# Patient Record
Sex: Female | Born: 1955 | Race: White | Hispanic: No | Marital: Married | Smoking: Never smoker
Health system: Southern US, Community
[De-identification: ages and names within clinical notes are randomized; demographics above are authoritative.]

## PROBLEM LIST (undated history)

## (undated) DIAGNOSIS — E7800 Pure hypercholesterolemia, unspecified: Secondary | ICD-10-CM

## (undated) DIAGNOSIS — K219 Gastro-esophageal reflux disease without esophagitis: Secondary | ICD-10-CM

## (undated) DIAGNOSIS — C541 Malignant neoplasm of endometrium: Secondary | ICD-10-CM

## (undated) DIAGNOSIS — M1990 Unspecified osteoarthritis, unspecified site: Secondary | ICD-10-CM

## (undated) DIAGNOSIS — J45909 Unspecified asthma, uncomplicated: Secondary | ICD-10-CM

## (undated) DIAGNOSIS — T7840XA Allergy, unspecified, initial encounter: Secondary | ICD-10-CM

## (undated) HISTORY — DX: Malignant neoplasm of endometrium: C54.1

## (undated) HISTORY — DX: Allergy, unspecified, initial encounter: T78.40XA

## (undated) HISTORY — PX: ABDOMINAL HYSTERECTOMY: SHX81

## (undated) HISTORY — DX: Gastro-esophageal reflux disease without esophagitis: K21.9

## (undated) HISTORY — DX: Unspecified asthma, uncomplicated: J45.909

---

## 1999-12-07 ENCOUNTER — Encounter: Payer: Self-pay | Admitting: Obstetrics and Gynecology

## 1999-12-07 ENCOUNTER — Encounter: Admission: RE | Admit: 1999-12-07 | Discharge: 1999-12-07 | Payer: Self-pay | Admitting: Obstetrics and Gynecology

## 2002-10-23 ENCOUNTER — Encounter: Payer: Self-pay | Admitting: Obstetrics and Gynecology

## 2002-10-23 ENCOUNTER — Encounter: Admission: RE | Admit: 2002-10-23 | Discharge: 2002-10-23 | Payer: Self-pay | Admitting: Obstetrics and Gynecology

## 2002-11-30 ENCOUNTER — Encounter: Admission: RE | Admit: 2002-11-30 | Discharge: 2002-11-30 | Payer: Self-pay | Admitting: Obstetrics and Gynecology

## 2002-11-30 ENCOUNTER — Encounter: Payer: Self-pay | Admitting: Obstetrics and Gynecology

## 2005-04-15 ENCOUNTER — Ambulatory Visit: Payer: Self-pay | Admitting: Internal Medicine

## 2005-05-14 ENCOUNTER — Ambulatory Visit: Payer: Self-pay | Admitting: Internal Medicine

## 2005-07-01 ENCOUNTER — Ambulatory Visit: Payer: Self-pay | Admitting: Internal Medicine

## 2005-07-02 ENCOUNTER — Ambulatory Visit: Payer: Self-pay | Admitting: Internal Medicine

## 2005-07-02 ENCOUNTER — Ambulatory Visit (HOSPITAL_COMMUNITY): Admission: RE | Admit: 2005-07-02 | Discharge: 2005-07-02 | Payer: Self-pay | Admitting: Internal Medicine

## 2005-07-23 ENCOUNTER — Ambulatory Visit: Payer: Self-pay | Admitting: Internal Medicine

## 2007-10-24 DIAGNOSIS — J309 Allergic rhinitis, unspecified: Secondary | ICD-10-CM | POA: Insufficient documentation

## 2007-10-24 DIAGNOSIS — J209 Acute bronchitis, unspecified: Secondary | ICD-10-CM | POA: Insufficient documentation

## 2007-10-25 ENCOUNTER — Ambulatory Visit: Payer: Self-pay | Admitting: Internal Medicine

## 2007-10-27 ENCOUNTER — Telehealth (INDEPENDENT_AMBULATORY_CARE_PROVIDER_SITE_OTHER): Payer: Self-pay | Admitting: *Deleted

## 2008-02-20 LAB — HM PAP SMEAR: HM Pap smear: ABNORMAL

## 2010-06-02 NOTE — Assessment & Plan Note (Signed)
Summary: FOLLOW UP/ MBW   Visit Type:  Follow-up PCP:  [facility]  Chief Complaint:  follow up visit .  History of Present Illness: Current Problems:  ALLERGIC RHINITIS (ICD-477.9) ASTHMATIC BRONCHITIS, ACUTE (ICD-52.52)  55 year old woman returning for follow-up of allergic rhinitis and asthmatic bronchitis.  She says she is neither worse nor better.  Phlegm in her throat in the mornings.  Feels full, under the xiphoid, without reflux.  Does not see benefit from Xopenex rescue inhaler.  Had normal pulmonary function tests 2007.  Normal CT scan, spiral, March of 2007.       Current Allergies (reviewed today): ! SULFA ! SEPTRA ! BIAXIN ! * ADVAIR ! ADVIL ! * ASPRIN  Past Medical History:    Reviewed history and no changes required:       ALLERGIC RHINITIS (ICD-477.9)       ASTHMATIC BRONCHITIS, ACUTE (ICD-466.0)            Review of Systems      See HPI   Vital Signs:  Patient Profile:   55 Years Old Female Weight:      196.25 pounds O2 Sat:      98 % O2 treatment:    Room Air Pulse rate:   96 / minute BP sitting:   150 / 84  (right arm) Cuff size:   regular  Vitals Entered By: Reynaldo Minium CMA (October 25, 2007 10:44 AM)             Comments Medications reviewed with patient Reynaldo Minium CMA  October 25, 2007 10:44 AM      Physical Exam  GENERAL:  A/Ox3; pleasant & cooperative.NAD HEENT:  [place]/AT, EOM-wnl, PERRLA, EACs-clear, TMs-wnl, NOSE-clear, THROAT-clear & wnl. NECK:  Supple w/ fair ROM; no JVD; normal carotid impulses w/o bruits; no thyromegaly or nodules palpated; no lymphadenopathy. CHEST: Clear to P&A HEART:  RRR, no m/r/g  heard ABDOMEN:  Soft & nt; nml bowel sounds; no organomegaly or masses detected. EXT: Warm bilat,  no calf pain, edema, clubbing, pulses intact Skin: no rash/lesion       Impression & Recommendations:  Problem # 1:  ALLERGIC RHINITIS (ICD-477.9) Assessment: Unchanged we discussed treatment options, and suggested,  Claritin.  Problem # 2:  ASTHMATIC BRONCHITIS, ACUTE (ICD-466.0) Assessment: Unchanged I discussed reflux precautions, and uses an over-the-counter acid blocker.  This may reduce the sensation of phlegm.  She feels in her throat in the mornings and may reduce the substernal pressure discomfort. Her updated medication list for this problem includes:    Xopenex Hfa 45 Mcg/act Aero (Levalbuterol tartrate) .Marland Kitchen... 2 puffs four times a day as needed   Medications Added to Medication List This Visit: 1)  Omega-3 Fish Oil 1000 Mg Caps (Omega-3 fatty acids) .... Take 1 by mouth once daily 2)  Biotene Dry Mouth Gel (Dentifrices) .... Once daily 3)  Xopenex Hfa 45 Mcg/act Aero (Levalbuterol tartrate) .... 2 puffs four times a day as needed   Patient Instructions: 1)  Please schedule a follow-up appointment in 1 year. 2)  Xopenex refilled 3)  Claritin is ok as needed 4)  Try taking an acid inhibitor- Prilosec or Pepcid (otc) before breakfast every day for a month to see if it helps the chest discomfot.   Prescriptions: XOPENEX HFA 45 MCG/ACT  AERO (LEVALBUTEROL TARTRATE) 2 puffs four times a day as needed  #1 x prn   Entered and Authorized by:   Waymon Budge MD   Signed by:  Waymon Budge MD on 10/25/2007   Method used:   Print then Give to Patient   RxID:   [id number]  ]  Appended Document: FOLLOW UP/ MBW-update family hx        Current Allergies: ! SULFA ! SEPTRA ! BIAXIN ! * ADVAIR ! ADVIL ! * ASPRIN   Family History:    Mother- living age 56; pt of Muskan Bolla's; asthma    Father- deceased age 71; heart disease    Sister- living age 58    Brother- living age 57            Heart Disease-grandfather, uncle     Cancer-aunt  Social History:    Never Smoked    Married with 2 children         ]

## 2010-06-02 NOTE — Progress Notes (Signed)
Summary: NEED PRESCRIPT  Phone Note Call from Patient   Caller: Patient Call For: YOUNG Summary of Call: WANT TO SPEAK TO NURSE ABOUT GETTING PRESCRIPT FOR PROTONIX [facility]  Follow-up for Phone Call        PT WANTS TO KNOW IF SHE CAN HAVE RX FOR PROTONIX-LAST OV CY SUGGESTED [facility] BUT PT'S  INS WILL PAY FOR PROTONIX-IF OK SEND RX TO [facility] Follow-up by: Philipp Deputy CMA,  October 27, 2007 2:26 PM  Additional Follow-up for Phone Call Additional follow up Details #1::        Have sent Rx Protonix to her drug store. Please let her know. Additional Follow-up by: Waymon Budge MD,  October 30, 2007 11:20 AM    Additional Follow-up for Phone Call Additional follow up Details #2::    PT AWARE  Philipp Deputy [facility]  October 30, 2007 11:58 AM   New/Updated Medications: PROTONIX 40 MG  TBEC (PANTOPRAZOLE SODIUM) 1 daily before meal   Prescriptions: PROTONIX 40 MG  TBEC (PANTOPRAZOLE SODIUM) 1 daily before meal  #30 x prn   Entered and Authorized by:   Waymon Budge MD   Signed by:   Waymon Budge MD on 10/30/2007   Method used:   [facility]ron[facility] to ...       [facility] [address] [phone]*       [address]       [place], [place]  [address]       Ph: [phone] or [phone]       Fax: [phone]   RxID:   [id number]

## 2010-11-05 ENCOUNTER — Encounter: Payer: Self-pay | Admitting: Family Medicine

## 2010-11-05 DIAGNOSIS — K219 Gastro-esophageal reflux disease without esophagitis: Secondary | ICD-10-CM | POA: Insufficient documentation

## 2010-11-05 DIAGNOSIS — C541 Malignant neoplasm of endometrium: Secondary | ICD-10-CM | POA: Insufficient documentation

## 2013-12-03 ENCOUNTER — Other Ambulatory Visit (HOSPITAL_COMMUNITY)
Admission: RE | Admit: 2013-12-03 | Discharge: 2013-12-03 | Disposition: A | Discharge status: Home / Self Care | Payer: BC Managed Care – PPO | Source: Ambulatory Visit | Attending: Family Medicine | Admitting: Family Medicine

## 2013-12-03 DIAGNOSIS — Z01419 Encounter for gynecological examination (general) (routine) without abnormal findings: Secondary | ICD-10-CM | POA: Insufficient documentation

## 2015-01-03 ENCOUNTER — Emergency Department (HOSPITAL_COMMUNITY)
Admission: EM | Admit: 2015-01-03 | Discharge: 2015-01-03 | Disposition: A | Discharge status: Home / Self Care | Payer: BC Managed Care – PPO | Attending: Emergency Medicine | Admitting: Emergency Medicine

## 2015-01-03 ENCOUNTER — Emergency Department (HOSPITAL_COMMUNITY): Payer: BC Managed Care – PPO

## 2015-01-03 ENCOUNTER — Encounter (HOSPITAL_COMMUNITY): Payer: Self-pay | Admitting: Emergency Medicine

## 2015-01-03 DIAGNOSIS — S199XXA Unspecified injury of neck, initial encounter: Secondary | ICD-10-CM | POA: Diagnosis present

## 2015-01-03 DIAGNOSIS — S1093XA Contusion of unspecified part of neck, initial encounter: Secondary | ICD-10-CM | POA: Insufficient documentation

## 2015-01-03 DIAGNOSIS — S1091XA Abrasion of unspecified part of neck, initial encounter: Secondary | ICD-10-CM | POA: Diagnosis not present

## 2015-01-03 DIAGNOSIS — Y9389 Activity, other specified: Secondary | ICD-10-CM | POA: Insufficient documentation

## 2015-01-03 DIAGNOSIS — Y92410 Unspecified street and highway as the place of occurrence of the external cause: Secondary | ICD-10-CM | POA: Insufficient documentation

## 2015-01-03 DIAGNOSIS — Z79899 Other long term (current) drug therapy: Secondary | ICD-10-CM | POA: Insufficient documentation

## 2015-01-03 DIAGNOSIS — Z8542 Personal history of malignant neoplasm of other parts of uterus: Secondary | ICD-10-CM | POA: Insufficient documentation

## 2015-01-03 DIAGNOSIS — Z8719 Personal history of other diseases of the digestive system: Secondary | ICD-10-CM | POA: Insufficient documentation

## 2015-01-03 DIAGNOSIS — Z8639 Personal history of other endocrine, nutritional and metabolic disease: Secondary | ICD-10-CM | POA: Diagnosis not present

## 2015-01-03 DIAGNOSIS — Y998 Other external cause status: Secondary | ICD-10-CM | POA: Insufficient documentation

## 2015-01-03 DIAGNOSIS — J45909 Unspecified asthma, uncomplicated: Secondary | ICD-10-CM | POA: Insufficient documentation

## 2015-01-03 DIAGNOSIS — S50812A Abrasion of left forearm, initial encounter: Secondary | ICD-10-CM | POA: Diagnosis not present

## 2015-01-03 DIAGNOSIS — S8012XA Contusion of left lower leg, initial encounter: Secondary | ICD-10-CM | POA: Insufficient documentation

## 2015-01-03 HISTORY — DX: Pure hypercholesterolemia, unspecified: E78.00

## 2015-01-03 LAB — I-STAT CHEM 8, ED
BUN: 17 mg/dL (ref 6–20)
Calcium, Ion: 1.11 mmol/L — ABNORMAL LOW (ref 1.13–1.30)
Chloride: 103 mmol/L (ref 101–111)
Creatinine, Ser: 0.7 mg/dL (ref 0.44–1.00)
Glucose, Bld: 108 mg/dL — ABNORMAL HIGH (ref 65–99)
HCT: 43 % (ref 36.0–46.0)
Hemoglobin: 14.6 g/dL (ref 12.0–15.0)
Potassium: 4.3 mmol/L (ref 3.5–5.1)
Sodium: 135 mmol/L (ref 135–145)
TCO2: 23 mmol/L (ref 0–100)

## 2015-01-03 MED ORDER — ACETAMINOPHEN 500 MG PO TABS
1000.0000 mg | ORAL_TABLET | Freq: Once | ORAL | Status: AC
  Administered 2015-01-03: 1000 mg via ORAL
  Filled 2015-01-03: qty 2

## 2015-01-03 MED ORDER — OXYCODONE-ACETAMINOPHEN 5-325 MG PO TABS: 1.0000 | ORAL_TABLET | ORAL | Status: DC | PRN
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
## 2015-01-03 NOTE — Discharge Instructions (Signed)
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
## 2015-01-07 MED ORDER — IOHEXOL 350 MG/ML SOLN
80.0000 mL | Freq: Once | INTRAVENOUS | Status: AC | PRN
  Administered 2015-01-07: 80 mL via INTRAVENOUS

## 2015-03-14 ENCOUNTER — Other Ambulatory Visit: Payer: Self-pay | Admitting: Family Medicine

## 2015-03-14 ENCOUNTER — Ambulatory Visit
Admission: RE | Admit: 2015-03-14 | Discharge: 2015-03-14 | Disposition: A | Discharge status: Home / Self Care | Payer: BC Managed Care – PPO | Source: Ambulatory Visit | Attending: Family Medicine | Admitting: Family Medicine

## 2015-03-14 DIAGNOSIS — S8012XA Contusion of left lower leg, initial encounter: Secondary | ICD-10-CM
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
## 2020-08-04 DIAGNOSIS — Z1231 Encounter for screening mammogram for malignant neoplasm of breast: Secondary | ICD-10-CM | POA: Diagnosis not present

## 2020-08-14 DIAGNOSIS — R928 Other abnormal and inconclusive findings on diagnostic imaging of breast: Secondary | ICD-10-CM | POA: Diagnosis not present

## 2020-08-28 DIAGNOSIS — D0511 Intraductal carcinoma in situ of right breast: Secondary | ICD-10-CM | POA: Diagnosis not present

## 2020-08-28 DIAGNOSIS — R928 Other abnormal and inconclusive findings on diagnostic imaging of breast: Secondary | ICD-10-CM | POA: Diagnosis not present

## 2020-08-31 DIAGNOSIS — C50919 Malignant neoplasm of unspecified site of unspecified female breast: Secondary | ICD-10-CM

## 2020-08-31 HISTORY — DX: Malignant neoplasm of unspecified site of unspecified female breast: C50.919

## 2020-09-01 ENCOUNTER — Telehealth: Payer: Self-pay | Admitting: Hematology and Oncology

## 2020-09-01 NOTE — Telephone Encounter (Signed)
Spoke to patient to confirm morning [facility] appointment for 5/11, emailed packet

## 2020-09-02 ENCOUNTER — Encounter: Payer: Self-pay | Admitting: Family Medicine

## 2020-09-05 ENCOUNTER — Encounter: Payer: Self-pay | Admitting: *Deleted

## 2020-09-05 DIAGNOSIS — D0511 Intraductal carcinoma in situ of right breast: Secondary | ICD-10-CM | POA: Insufficient documentation

## 2020-09-09 ENCOUNTER — Encounter: Payer: Self-pay | Admitting: *Deleted

## 2020-09-09 NOTE — Progress Notes (Signed)
[place] NOTE  Patient Care Team: Susy Frizzle, MD as PCP - General (Family Medicine) Mauro Kaufmann, RN as Oncology Nurse Navigator Rockwell Germany, RN as Oncology Nurse Navigator Rolm Bookbinder, MD as Consulting Physician (General Surgery) Nicholas Lose, MD as Consulting Physician (Hematology and Oncology) Gery Pray, MD as Consulting Physician (Radiation Oncology)  CHIEF COMPLAINTS/PURPOSE OF CONSULTATION:  Newly diagnosed DCIS of the right breast  HISTORY OF PRESENTING ILLNESS:  Monica Perry 65 y.o. female is here because of recent diagnosis of right breast DCIS. Screening mammogram on 08/04/20 showed a 1.6cm group of calcifications at the 9 o'clock position in the right breast. Diagnostic mammogram and [place] on 08/14/20 showed the 1.6cm group of calcifications. She presents to the clinic today for initial evaluation and discussion of treatment options.   I reviewed her records extensively and collaborated the history with the patient.  SUMMARY OF ONCOLOGIC HISTORY: 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%     MEDICAL HISTORY:  Past Medical History:  Diagnosis Date  . Allergy   . Asthmatic bronchitis   . Elevated cholesterol   . Endometrial carcinoma ([place])   . GERD (gastroesophageal reflux disease)     SURGICAL HISTORY: Past Surgical History:  Procedure Laterality Date  . ABDOMINAL HYSTERECTOMY    . CESAREAN SECTION      SOCIAL HISTORY: Social History   Socioeconomic History  . Marital status: Married    Spouse name: Not on file  . Number of children: Not on file  . Years of education: Not on file  . Highest education level: Not on file  Occupational History  . Not on file  Tobacco Use  . Smoking status: Never Smoker   . Smokeless tobacco: Not on file  Substance and Sexual Activity  . Alcohol use: No  . Drug use: No  . Sexual activity: Not on file  Other Topics Concern  . Not on file  Social History Narrative  . Not on file   Social Determinants of Health   Financial Resource Strain: Not on file  Food Insecurity: Not on file  Transportation Needs: Not on file  Physical Activity: Not on file  Stress: Not on file  Social Connections: Not on file  Intimate Partner Violence: Not on file    FAMILY HISTORY: No family history on file.  ALLERGIES:  is allergic to clarithromycin, fluticasone-salmeterol, ibuprofen, red dye, septra [sulfamethoxazole-trimethoprim], and sulfonamide derivatives.  MEDICATIONS:  Current Outpatient Medications  Medication Sig Dispense Refill  . Biotin 5 MG CAPS Take by mouth.    . Calcium Carb-Cholecalciferol (CALCIUM 1000 + D PO) Take 1 tablet by mouth daily.    . calcium carbonate (TUMS - DOSED IN MG ELEMENTAL CALCIUM) 500 MG chewable tablet Chew 1 tablet by mouth daily.    . fish oil-omega-3 fatty acids 1000 MG capsule Take 2 g by mouth daily.    . Multiple Vitamins-Minerals (MULTIVITAMIN WITH MINERALS) tablet Take 1 tablet by mouth daily.    . pravastatin (PRAVACHOL) 20 MG tablet Take 1 tablet by mouth daily.     No current facility-administered medications for this visit.    REVIEW OF SYSTEMS:     All other systems were reviewed with the patient and are negative.  PHYSICAL EXAMINATION: ECOG PERFORMANCE STATUS: 1 - Symptomatic  but completely ambulatory  Vitals:   09/10/20 0849  BP: (!) 169/90  Pulse: 79  Resp: 16  Temp: (!) 97.2 F (36.2 C)  SpO2: 97%   Filed Weights   09/10/20 0849  Weight: 187 lb 11.2 oz (85.1 kg)      LABORATORY DATA:  I have reviewed the data as listed Lab Results  Component Value Date   WBC 7.2 09/10/2020   HGB 14.3 09/10/2020   HCT 42.8 09/10/2020   MCV 88.2 09/10/2020   PLT 260 09/10/2020   Lab Results  Component  Value Date   NA 142 09/10/2020   K 3.9 09/10/2020   CL 105 09/10/2020   CO2 26 09/10/2020    RADIOGRAPHIC STUDIES: I have personally reviewed the radiological reports and agreed with the findings in the report.  ASSESSMENT AND PLAN:  Ductal carcinoma in situ (DCIS) of right breast 09/05/2020:Screening detected left breast calcifications 0.4 cm span, stereotactic biopsy revealed high-grade DCIS with calcifications and necrosis ER 80%, PR 5%  Pathology review: I discussed with the patient the difference between DCIS and invasive breast cancer. It is considered a precancerous lesion. DCIS is classified as a 0. It is generally detected through mammograms as calcifications. We discussed the significance of grades and its impact on prognosis. We also discussed the importance of ER and PR receptors and their implications to adjuvant treatment options. Prognosis of DCIS dependence on grade, comedo necrosis. It is anticipated that if not treated, 20-30% of DCIS can develop into invasive breast cancer.  Recommendation: 1. Breast conserving surgery 2. Followed by adjuvant radiation therapy 3. Followed by antiestrogen therapy with tamoxifen 5 years  Tamoxifen counseling: We discussed the risks and benefits of tamoxifen. These include but not limited to insomnia, hot flashes, mood changes, vaginal dryness, and weight gain. Although rare, serious side effects including endometrial cancer, risk of blood clots were also discussed. We strongly believe that the benefits far outweigh the risks. Patient understands these risks and consented to starting treatment. Planned treatment duration is 5 years.  Return to clinic after surgery to discuss the final pathology report and come up with an adjuvant treatment plan.     All questions were answered. The patient knows to call the clinic with any problems, questions or concerns.   Rulon Eisenmenger, MD, MPH 09/10/2020    I, Molly Dorshimer, am acting as scribe  for Nicholas Lose, MD.  I have reviewed the above documentation for accuracy and completeness, and I agree with the above.

## 2020-09-10 ENCOUNTER — Inpatient Hospital Stay: Payer: Medicare PPO | Attending: Hematology and Oncology | Admitting: Hematology and Oncology

## 2020-09-10 ENCOUNTER — Ambulatory Visit: Payer: Self-pay | Admitting: Physical Therapy

## 2020-09-10 ENCOUNTER — Encounter: Payer: Self-pay | Admitting: *Deleted

## 2020-09-10 ENCOUNTER — Ambulatory Visit
Admission: RE | Admit: 2020-09-10 | Discharge: 2020-09-10 | Disposition: A | Discharge status: Home / Self Care | Payer: Medicare PPO | Source: Ambulatory Visit | Attending: Radiation Oncology | Admitting: Radiation Oncology

## 2020-09-10 ENCOUNTER — Encounter: Payer: Self-pay | Admitting: Licensed Clinical Social Worker

## 2020-09-10 ENCOUNTER — Ambulatory Visit: Payer: Medicare PPO | Admitting: Genetic Counselor

## 2020-09-10 ENCOUNTER — Other Ambulatory Visit: Payer: Self-pay | Admitting: General Surgery

## 2020-09-10 ENCOUNTER — Other Ambulatory Visit: Payer: Self-pay

## 2020-09-10 ENCOUNTER — Inpatient Hospital Stay: Payer: Medicare PPO

## 2020-09-10 ENCOUNTER — Encounter: Payer: Self-pay | Admitting: Genetic Counselor

## 2020-09-10 DIAGNOSIS — Z8542 Personal history of malignant neoplasm of other parts of uterus: Secondary | ICD-10-CM | POA: Diagnosis not present

## 2020-09-10 DIAGNOSIS — D0511 Intraductal carcinoma in situ of right breast: Secondary | ICD-10-CM

## 2020-09-10 DIAGNOSIS — Z8041 Family history of malignant neoplasm of ovary: Secondary | ICD-10-CM

## 2020-09-10 DIAGNOSIS — C541 Malignant neoplasm of endometrium: Secondary | ICD-10-CM

## 2020-09-10 DIAGNOSIS — Z170 Estrogen receptor positive status [ER+]: Secondary | ICD-10-CM | POA: Diagnosis not present

## 2020-09-10 DIAGNOSIS — Z79899 Other long term (current) drug therapy: Secondary | ICD-10-CM | POA: Insufficient documentation

## 2020-09-10 DIAGNOSIS — K219 Gastro-esophageal reflux disease without esophagitis: Secondary | ICD-10-CM | POA: Diagnosis not present

## 2020-09-10 DIAGNOSIS — Z803 Family history of malignant neoplasm of breast: Secondary | ICD-10-CM | POA: Diagnosis not present

## 2020-09-10 DIAGNOSIS — E7800 Pure hypercholesterolemia, unspecified: Secondary | ICD-10-CM | POA: Diagnosis not present

## 2020-09-10 DIAGNOSIS — Z808 Family history of malignant neoplasm of other organs or systems: Secondary | ICD-10-CM

## 2020-09-10 HISTORY — DX: Personal history of malignant neoplasm of other parts of uterus: Z85.42

## 2020-09-10 HISTORY — DX: Family history of malignant neoplasm of other organs or systems: Z80.8

## 2020-09-10 HISTORY — DX: Family history of malignant neoplasm of breast: Z80.3

## 2020-09-10 HISTORY — DX: Family history of malignant neoplasm of ovary: Z80.41

## 2020-09-10 LAB — CBC WITH DIFFERENTIAL (CANCER CENTER ONLY)
Abs Immature Granulocytes: 0.04 10*3/uL (ref 0.00–0.07)
Basophils Absolute: 0.1 10*3/uL (ref 0.0–0.1)
Basophils Relative: 1 %
Eosinophils Absolute: 0.1 10*3/uL (ref 0.0–0.5)
Eosinophils Relative: 1 %
HCT: 42.8 % (ref 36.0–46.0)
Hemoglobin: 14.3 g/dL (ref 12.0–15.0)
Immature Granulocytes: 1 %
Lymphocytes Relative: 15 %
Lymphs Abs: 1.1 10*3/uL (ref 0.7–4.0)
MCH: 29.5 pg (ref 26.0–34.0)
MCHC: 33.4 g/dL (ref 30.0–36.0)
MCV: 88.2 fL (ref 80.0–100.0)
Monocytes Absolute: 0.5 10*3/uL (ref 0.1–1.0)
Monocytes Relative: 7 %
Neutro Abs: 5.5 10*3/uL (ref 1.7–7.7)
Neutrophils Relative %: 75 %
Platelet Count: 260 10*3/uL (ref 150–400)
RBC: 4.85 MIL/uL (ref 3.87–5.11)
RDW: 13 % (ref 11.5–15.5)
WBC Count: 7.2 10*3/uL (ref 4.0–10.5)
nRBC: 0 % (ref 0.0–0.2)

## 2020-09-10 LAB — CMP (CANCER CENTER ONLY)
ALT: 16 U/L (ref 0–44)
AST: 19 U/L (ref 15–41)
Albumin: 4.1 g/dL (ref 3.5–5.0)
Alkaline Phosphatase: 79 U/L (ref 38–126)
Anion gap: 11 (ref 5–15)
BUN: 12 mg/dL (ref 8–23)
CO2: 26 mmol/L (ref 22–32)
Calcium: 10.4 mg/dL — ABNORMAL HIGH (ref 8.9–10.3)
Chloride: 105 mmol/L (ref 98–111)
Creatinine: 0.73 mg/dL (ref 0.44–1.00)
GFR, Estimated: >60 mL/min (ref >60)
Glucose, Bld: 102 mg/dL — ABNORMAL HIGH (ref 70–99)
Potassium: 3.9 mmol/L (ref 3.5–5.1)
Sodium: 142 mmol/L (ref 135–145)
Total Bilirubin: 0.6 mg/dL (ref 0.3–1.2)
Total Protein: 7.2 g/dL (ref 6.5–8.1)

## 2020-09-10 LAB — GENETIC SCREENING ORDER

## 2020-09-10 NOTE — Progress Notes (Signed)
Radiation Oncology         [phone] ________________________________  Multidisciplinary Breast Oncology Clinic [facility]) Initial Outpatient Consultation  Name: Monica Perry MRN: [id number]  Date: 09/10/2020  DOB: [birth year]  [id number], Cammie Mcgee, MD  Rolm Bookbinder, MD   REFERRING PHYSICIAN: Rolm Bookbinder, MD  DIAGNOSIS: The encounter diagnosis was Endometrial carcinoma [facility]).  Stage 0 Right Breast UOQ, DCIS, ER + / PR + /, Grade 3      ICD-10-CM   1. Endometrial carcinoma (HCC)  C54.1     HISTORY OF PRESENT ILLNESS::Cire Jerilynn Mages Watters is a 65 y.o. female who is presenting to the office today for evaluation of her newly diagnosed breast cancer. She is accompanied by her identical twin sister. (Her daughters are also identical twins.) She is doing well overall.  She notes a maternal aunt with ovarian cancer, which qualifies her for genetic testing.   She had routine screening mammography on 08/04/20 showing a possible abnormality in the right breast. She underwent bilateral diagnostic mammography with tomography at [facility] on 08/14/20 showing: 1.6cm group of linear branching calcification at 9 o'clock.  Biopsy on 08/28/20 showed: DCIS, high grade. Prognostic indicators significant for: estrogen receptor, 90% positive and progesterone receptor, 35% positive , both with moderate/ strong staining intensity.   Menarche: 65 years old Age at first live birth: 65 years old GP: 2 LMP: 2009 Contraceptive: Yes, for the past 3 years HRT: No   The patient was referred today for presentation in the multidisciplinary conference.  Radiology studies and pathology slides were presented there for review and discussion of treatment options.  A consensus was discussed regarding potential next steps.  PREVIOUS RADIATION THERAPY: No  PAST MEDICAL HISTORY:  Past Medical History:  Diagnosis Date  . Allergy   . Asthmatic bronchitis   . Elevated cholesterol   . Endometrial carcinoma ([place])    . Family history of breast cancer 09/10/2020  . Family history of melanoma 09/10/2020  . Family history of ovarian cancer 09/10/2020  . GERD (gastroesophageal reflux disease)   . History of endometrial cancer 09/10/2020    PAST SURGICAL HISTORY: Past Surgical History:  Procedure Laterality Date  . ABDOMINAL HYSTERECTOMY    . CESAREAN SECTION      FAMILY HISTORY:  Family History  Problem Relation Age of Onset  . Lymphoma Brother        dx late 23s  . Melanoma Brother        dx after 76  . Ovarian cancer Maternal Aunt        dx 50s  . Breast cancer Paternal Aunt        dx 6s  . Uterine cancer Maternal Aunt        dx 69s    SOCIAL HISTORY:  Social History   Socioeconomic History  . Marital status: Married    Spouse name: Not on file  . Number of children: Not on file  . Years of education: Not on file  . Highest education level: Not on file  Occupational History  . Not on file  Tobacco Use  . Smoking status: Never Smoker  . Smokeless tobacco: Not on file  Substance and Sexual Activity  . Alcohol use: No  . Drug use: No  . Sexual activity: Not on file  Other Topics Concern  . Not on file  Social History Narrative  . 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  ALLERGIES:  Allergies  Allergen Reactions  . Clarithromycin   . Fluticasone-Salmeterol   . Ibuprofen   . Red Dye   . Septra [Sulfamethoxazole-Trimethoprim]   . Sulfonamide Derivatives     MEDICATIONS:  Current Outpatient Medications  Medication Sig Dispense Refill  . Biotin 5 MG CAPS Take by mouth.    . Calcium Carb-Cholecalciferol (CALCIUM 1000 + D PO) Take 1 tablet by mouth daily.    . calcium carbonate (TUMS - DOSED IN MG  ELEMENTAL CALCIUM) 500 MG chewable tablet Chew 1 tablet by mouth daily.    . fish oil-omega-3 fatty acids 1000 MG capsule Take 2 g by mouth daily.    . Multiple Vitamins-Minerals (MULTIVITAMIN WITH MINERALS) tablet Take 1 tablet by mouth daily.    . pravastatin (PRAVACHOL) 20 MG tablet Take 1 tablet by mouth daily.     No current facility-administered medications for this encounter.    REVIEW OF SYSTEMS: A 10+ POINT REVIEW OF SYSTEMS WAS OBTAINED including neurology, dermatology, psychiatry, cardiac, respiratory, lymph, extremities, GI, GU, musculoskeletal, constitutional, reproductive, HEENT. On the provided form, she reports no current issues. She denies any symptoms today.  No reports of breast pain nipple discharge or bleeding   PHYSICAL EXAM:  Vitals with BMI 09/10/2020  Height 5\' 4"   Weight 187 lbs 11 oz  BMI 93.7  Systolic 169  Diastolic 90  Pulse 79   Lungs are clear to auscultation bilaterally. Heart has regular rate and rhythm. No palpable cervical, supraclavicular, or axillary adenopathy. Abdomen soft, non-tender, normal bowel sounds. Breast: Left breast with no palpable mass, nipple discharge, or bleeding. Right breast with biopsy site in 9 o'clock position, approximately 2 cm from areolar border, some bruising in this area and along nipple/areolar area, palpable induration from biopsy.   KPS = 90  100 - Normal; no complaints; no evidence of disease. 90   - Able to carry on normal activity; minor signs or symptoms of disease. 80   - Normal activity with effort; some signs or symptoms of disease. 15   - Cares for self; unable to carry on normal activity or to do active work. 60   - Requires occasional assistance, but is able to care for most of his personal needs. 50   - Requires considerable assistance and frequent medical care. 23   - Disabled; requires special care and assistance. 44   - Severely disabled; hospital admission is indicated although death not imminent. 45    - Very sick; hospital admission necessary; active supportive treatment necessary. 10   - Moribund; fatal processes progressing rapidly. 0     - Dead  Karnofsky DA, Abelmann [place], Craver LS and Burchenal [facility] [phone]) The use of the nitrogen mustards in the palliative treatment of carcinoma: with particular reference to bronchogenic carcinoma Cancer 1 634-56  LABORATORY DATA:  Lab Results  Component Value Date   WBC 7.2 09/10/2020   HGB 14.3 09/10/2020   HCT 42.8 09/10/2020   MCV 88.2 09/10/2020   PLT 260 09/10/2020   Lab Results  Component Value Date   NA 142 09/10/2020   K 3.9 09/10/2020   CL 105 09/10/2020   CO2 26 09/10/2020   Lab Results  Component Value Date   ALT 16 09/10/2020  AST 19 09/10/2020   ALKPHOS 79 09/10/2020   BILITOT 0.6 09/10/2020    PULMONARY FUNCTION TEST:   Recent Review Flowsheet Data   There is no flowsheet data to display.     RADIOGRAPHY: No results found.    IMPRESSION:  Stage 0 Right Breast UOQ, DCIS, ER + / PR + /, Grade 3   Patient will be a good candidate for breast conservation with radiotherapy to right breast. We discussed the general course of radiation, potential side effects, and toxicities with radiation and the patient is interested in this approach.   PLAN:  1. Genetic Testing (family history of ovarian cancer) 2. Right lumpectomy 3. Adjuvant radiation therapy 4. Anti-estrogen therapy    ------------------------------------------------  Blair Promise, PhD, MD  This document serves as a record of services personally performed by Gery Pray, MD. It was created on his behalf by Roney Mans, a trained medical scribe. The creation of this record is based on the scribe's personal observations and the provider's statements to them. This document has been checked and approved by the attending provider.

## 2020-09-10 NOTE — Progress Notes (Signed)
[place] Work  Initial Assessment   Monica Perry is a 65 y.o. year old female accompanied by patient and twin sister. Clinical Social Work was referred by [facility] for assessment of psychosocial needs.   SDOH (Social Determinants of Health) assessments performed: Yes SDOH Interventions   Flowsheet Row Most Recent Value  SDOH Interventions   Food Insecurity Interventions Intervention Not Indicated  Financial Strain Interventions Intervention Not Indicated  Housing Interventions Intervention Not Indicated  Transportation Interventions Intervention Not Indicated      Distress Screen completed: Yes ONCBCN DISTRESS SCREENING 09/10/2020  Screening Type Initial Screening  Distress experienced in past week (1-10) 7  Family Problem type Children  Emotional problem type Nervousness/Anxiety;Adjusting to illness      Family/Social Information:  . Housing Arrangement: patient lives with husband, Monica Perry. Twin 49yo daughters both live in [place] . Family members/support persons in your life? Family, particularly her husband and sister . Transportation concerns: no  . 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 Income source: Employment and retirement income . Financial concerns: No o Type of concern: None . Food access concerns: no . Religious or spiritual practice: yes . Services Currently in place:  n/a  Coping/ Adjustment to diagnosis: . Patient understands treatment plan and what happens next? yes . Concerns about diagnosis and/or treatment: had concerns before today but is starting to feel better after learning more about specific diagnosis and treatment plan . Patient reported stressors: Children and Adjusting to my illness . Patient enjoys reading and going to the beach, working with kids, time with grandkids . Current coping skills/ strengths: Ability for insight, Capable of independent living, Motivation for treatment/growth, Special hobby/interest and Supportive  family/friends    SUMMARY: Current SDOH Barriers:  . No significant SDOH barriers noted today  Interventions: . Discussed common feeling and emotions when being diagnosed with cancer, and the importance of support during treatment . Informed patient of the support team roles and support services at [facility] . Provided CSW contact information and encouraged patient to call with any questions or concerns . Referred patient for an [place] guide/ peer mentor    Follow Up Plan: Patient will contact CSW with any support or resource needs Patient verbalizes understanding of plan: Yes    Monica Perry , LCSW

## 2020-09-10 NOTE — Progress Notes (Signed)
REFERRING PROVIDER: Nicholas Lose, MD [place],  [place] [address]  PRIMARY PROVIDER:  Susy Frizzle, MD  PRIMARY REASON FOR VISIT:  Encounter Diagnoses  Name Primary?  . Ductal carcinoma in situ (DCIS) of right breast Yes  . History of endometrial cancer   . Family history of ovarian cancer   . Family history of breast cancer   . Family history of melanoma      HISTORY OF PRESENT ILLNESS:   Ms. Monica Perry, a 65 y.o. female, was seen for a [place] cancer genetics consultation during the breast multidisciplinary clinic at the request of Dr. Lindi Adie due to a personal and family history of cancer.  Ms. Lyons presents to clinic today with her sister to discuss the possibility of a hereditary predisposition to cancer, to discuss genetic testing, and to further clarify her future cancer risks, as well as potential cancer risks for family members.   In 2022, at the age of 35, Ms. Dufresne was diagnosed with ductal carcinoma in situ of the right breast (ER+/PR+).  The preliminary treatment plan includes lumpectomy, adjuvant radiation, and anti-estrogens.  Ms. Miler has also has a personal history of uterine cancer diagnosed in 2009 (around age 71), which was treated at [facility].   CANCER HISTORY:  Oncology History   No history exists.    RISK FACTORS:  Menarche was at age 67.  First live birth at age 31.  OCP use for approximately 3 years.  Ovaries intact: no.  Hysterectomy: yes.  Menopausal status: postmenopausal.  HRT use: 0 years. Colonoscopy: yes; most recent in 2017. Mammogram within the last year: yes.    Past Medical History:  Diagnosis Date  . Allergy   . Asthmatic bronchitis   . Elevated cholesterol   . Endometrial carcinoma ([place])   . GERD (gastroesophageal reflux disease)     Past Surgical History:  Procedure Laterality Date  . ABDOMINAL HYSTERECTOMY    . CESAREAN SECTION      Social History   Socioeconomic History  .  Marital status: Married    Spouse name: Not on file  . Number of children: Not on file  . Years of education: Not on file  . Highest education level: Not on file  Occupational History  . Not on file  Tobacco Use  . Smoking status: Never Smoker  . Smokeless tobacco: Not on file  Substance and Sexual Activity  . Alcohol use: No  . Drug use: No  . Sexual activity: Not on file  Other Topics Concern  . Not on file  Social History Narrative  . Not on file   Social Determinants of Health   Financial Resource Strain: Not on file  Food Insecurity: Not on file  Transportation Needs: Not on file  Physical Activity: Not on file  Stress: Not on file  Social Connections: Not on file     FAMILY HISTORY:  We obtained a detailed, 4-generation family history.  Significant diagnoses are listed below: Family History  Problem Relation Age of Onset  . Lymphoma Brother        dx late 25s  . Melanoma Brother        dx after 53  . Ovarian cancer Maternal Aunt        dx 60s  . Breast cancer Paternal Aunt        dx 24s  . Uterine cancer Maternal Aunt        dx 10s     Ms.  Strickler is unaware of previous family history of genetic testing for hereditary cancer risks. Patient's maternal ancestors are of [place] descent, and paternal ancestors are of [place] descent. There is no reported Ashkenazi Jewish ancestry. There is no known consanguinity.  GENETIC COUNSELING ASSESSMENT: Ms. Hosmer is a 65 y.o. female with a personal and family history of cancer which is somewhat suggestive of a hereditary cancer syndrome and predisposition to cancer given the presence of related cancers in her maternal family. We, therefore, discussed and recommended the following at today's visit.   DISCUSSION: We discussed that 5 - 10% of cancer is hereditary, with most cases of hereditary breast cancer associated with mutations in BRCA1/2.  There are other genes that can be associated with hereditary breast or  uterine cancer syndromes.  Type of cancer risk and level of risk are gene-specific. We discussed that testing is beneficial for several reasons including knowing how to follow individuals after completing their treatment, identifying whether potential treatment options would be beneficial, and understanding if other family members could be at risk for cancer and allowing them to undergo genetic testing.   We reviewed the characteristics, features and inheritance patterns of hereditary cancer syndromes. We also discussed genetic testing, including the appropriate family members to test, the process of testing, insurance coverage and turn-around-time for results. We discussed the implications of a negative, positive and/or variant of uncertain significant result. In order to get genetic test results in a timely manner so that Ms. Holness can use these genetic test results for surgical decisions, we recommended Ms. Kingma pursue genetic testing for the BRCAPlus Panel.  The BRCAplus panel offered by [employer] and includes sequencing and deletion/duplication analysis for the following 8 genes: ATM, BRCA1, BRCA2, CDH1, CHEK2, PALB2, PTEN, and TP53. Once complete, we recommend Ms. Troutman pursue reflex genetic testing to a more comprehensive gene panel.   The CancerNext-Expanded gene panel offered by [facility] and includes sequencing, rearrangement, and RNA analysis for the following 77 genes: AIP, ALK, APC, ATM, AXIN2, BAP1, BARD1, BLM, BMPR1A, BRCA1, BRCA2, BRIP1, CDC73, CDH1, CDK4, CDKN1B, CDKN2A, CHEK2, CTNNA1, DICER1, FANCC, FH, FLCN, GALNT12, KIF1B, LZTR1, MAX, MEN1, MET, MLH1, MSH2, MSH3, MSH6, MUTYH, NBN, NF1, NF2, NTHL1, PALB2, PHOX2B, PMS2, POT1, PRKAR1A, PTCH1, PTEN, RAD51C, RAD51D, RB1, RECQL, RET, SDHA, SDHAF2, SDHB, SDHC, SDHD, SMAD4, SMARCA4, SMARCB1, SMARCE1, STK11, SUFU, TMEM127, TP53, TSC1, TSC2, VHL and XRCC2 (sequencing and deletion/duplication); EGFR, EGLN1, HOXB13, KIT, MITF, PDGFRA,  POLD1, and POLE (sequencing only); EPCAM and GREM1 (deletion/duplication only).   Based on Ms. Kydd's personal and family history of cancer, she meets medical criteria for genetic testing. Despite that she meets criteria, she may still have an out of pocket cost. We discussed that if her out of pocket cost for testing is over $100, the laboratory should contact her to discuss self-pay prices, patient pay assistance programs, if applicable, and other billing options.   PLAN: After considering the risks, benefits, and limitations, Ms. Berndt provided informed consent to pursue genetic testing and the blood sample was sent to [facility] for analysis of the [place] +RNAinsight Panel. Results should be available within approximately 1-2 weeks' time, at which point they will be disclosed by telephone to Ms. Ezra, as will any additional recommendations warranted by these results. Ms. Linford will receive a summary of her genetic counseling visit and a copy of her results once available. This information will also be available in Epic.   Lastly, we encouraged Ms. Hugill to remain in  contact with cancer genetics annually so that we can continuously update the family history and inform her of any changes in cancer genetics and testing that may be of benefit for this family.   Ms. Kotara questions were answered to her satisfaction today. Our contact information was provided should additional questions or concerns arise. Thank you for the referral and allowing [place] to share in the care of your patient.   Derrek Puff M. Joette Catching, [place], [facility] Genetic Counselor [email or web address] (P) [phone]  The patient was seen for a total of 20 minutes in face-to-face genetic counseling.  Drs. Magrinat, Lindi Adie and/or Burr Medico were available to discuss this case as needed.  _______________________________________________________________________ For Office Staff:  Number of people  involved in session: 1 Was an Intern/ student involved with case: no

## 2020-09-10 NOTE — Assessment & Plan Note (Signed)
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 It is considered a precancerous lesion. DCIS is classified as a 0. It is generally detected through mammograms as calcifications. We discussed the significance of grades and its impact on prognosis. We also discussed the importance of ER and PR receptors and their implications to adjuvant treatment options. Prognosis of DCIS dependence on grade, comedo necrosis. It is anticipated that if not treated, 20-30% of DCIS can develop into invasive breast cancer.  Recommendation: 1. Breast conserving surgery 2. Followed by adjuvant radiation therapy 3. Followed by antiestrogen therapy with tamoxifen 5 years  Tamoxifen counseling: We discussed the risks and benefits of tamoxifen. These include but not limited to insomnia, hot flashes, mood changes, vaginal dryness, and weight gain. Although rare, serious side effects including endometrial cancer, risk of blood clots were also discussed. We strongly believe that the benefits far outweigh the risks. Patient understands these risks and consented to starting treatment. Planned treatment duration is 5 years.  Return to clinic after surgery to discuss the final pathology report and come up with an adjuvant treatment plan.

## 2020-09-11 ENCOUNTER — Telehealth: Payer: Self-pay | Admitting: *Deleted

## 2020-09-11 ENCOUNTER — Encounter: Payer: Self-pay | Admitting: *Deleted

## 2020-09-11 ENCOUNTER — Other Ambulatory Visit: Payer: Self-pay | Admitting: *Deleted

## 2020-09-11 DIAGNOSIS — D0511 Intraductal carcinoma in situ of right breast: Secondary | ICD-10-CM

## 2020-09-11 NOTE — Telephone Encounter (Signed)
Left message to follow up from [facility] 5/11 and assess navigation needs.

## 2020-09-16 ENCOUNTER — Telehealth: Payer: Self-pay | Admitting: Hematology and Oncology

## 2020-09-16 NOTE — Telephone Encounter (Signed)
Scheduled appt epr 5/12 sch msg. Pt aware.

## 2020-09-17 ENCOUNTER — Encounter: Payer: Self-pay | Admitting: Genetic Counselor

## 2020-09-17 ENCOUNTER — Telehealth: Payer: Self-pay | Admitting: Genetic Counselor

## 2020-09-17 DIAGNOSIS — Z1379 Encounter for other screening for genetic and chromosomal anomalies: Secondary | ICD-10-CM | POA: Insufficient documentation

## 2020-09-17 NOTE — Telephone Encounter (Addendum)
Revealed negative genetic testing.  Discussed that we do not know why she has breast cancer or why there is cancer in the family. It could be sporadic/familial, due to a different gene that we are not testing, or maybe our current technology may not be able to pick something up.  It will be important for her to keep in contact with genetics to keep up with whether additional testing may be needed.  Results of pan-cancer panel are pending.    

## 2020-09-30 ENCOUNTER — Telehealth: Payer: Self-pay | Admitting: Genetic Counselor

## 2020-09-30 ENCOUNTER — Other Ambulatory Visit: Payer: Self-pay

## 2020-09-30 ENCOUNTER — Encounter (HOSPITAL_BASED_OUTPATIENT_CLINIC_OR_DEPARTMENT_OTHER): Payer: Self-pay | Admitting: General Surgery

## 2020-09-30 NOTE — Telephone Encounter (Signed)
Disclosed that results were negative for hereditary pan-cancer panel.  Ms. Balcom said she will call back later in the day when she was able to discuss results further.

## 2020-10-01 ENCOUNTER — Ambulatory Visit: Payer: Self-pay | Admitting: Genetic Counselor

## 2020-10-01 ENCOUNTER — Telehealth: Payer: Self-pay | Admitting: Genetic Counselor

## 2020-10-01 DIAGNOSIS — Z8542 Personal history of malignant neoplasm of other parts of uterus: Secondary | ICD-10-CM

## 2020-10-01 DIAGNOSIS — Z808 Family history of malignant neoplasm of other organs or systems: Secondary | ICD-10-CM

## 2020-10-01 DIAGNOSIS — D0511 Intraductal carcinoma in situ of right breast: Secondary | ICD-10-CM

## 2020-10-01 DIAGNOSIS — Z803 Family history of malignant neoplasm of breast: Secondary | ICD-10-CM

## 2020-10-01 DIAGNOSIS — Z8041 Family history of malignant neoplasm of ovary: Secondary | ICD-10-CM

## 2020-10-01 NOTE — Progress Notes (Addendum)
HPI:  Monica Perry was previously seen in the [place] clinic due to a personal and family history of cancer and concerns regarding a hereditary predisposition to cancer. Please refer to our prior cancer genetics clinic note for more information regarding our discussion, assessment and recommendations, at the time. Monica Perry's recent genetic test results were disclosed to her, as were recommendations warranted by these results. These results and recommendations are discussed in more detail below.  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  The preliminary treatment plan includes lumpectomy, adjuvant radiation, and anti-estrogens.  Monica Perry has also has a personal history of uterine cancer diagnosed in 2009 (around age 5), which was treated at [facility]. 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%   09/10/2020 Cancer Staging   Staging form: Breast, AJCC 8th Edition - Clinical stage from 09/10/2020: Stage 0 (cTis (DCIS), cN0, cM0, ER+, PR+, HER2: Not Assessed) - Signed by Nicholas Lose, MD on 09/10/2020 Stage prefix: Initial diagnosis Nuclear grade: G3 Laterality: Right Staged by: Pathologist and managing physician Stage used in treatment planning: Yes National guidelines used in treatment planning: Yes Type of national guideline used in treatment planning: NCCN   09/16/2020 Genetic Testing   Negative hereditary cancer genetic testing: no pathogenic variants detected in [place] Panel or [facility] CancerNext-Expanded +RNAinsight Panel.  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 The report dates are Sep 16, 2020 and Sep 30, 2020.    The BRCAplus  panel offered by [employer] [facility] and includes sequencing and deletion/duplication analysis for the following 8 genes: ATM, BRCA1, BRCA2, CDH1, CHEK2, PALB2, PTEN, and TP53.  The CancerNext-Expanded gene panel offered by [facility] and includes sequencing, rearrangement, and RNA analysis for the following 77 genes: AIP, ALK, APC, ATM, AXIN2, BAP1, BARD1, BLM, BMPR1A, BRCA1, BRCA2, BRIP1, CDC73, CDH1, CDK4, CDKN1B, CDKN2A, CHEK2, CTNNA1, DICER1, FANCC, FH, FLCN, GALNT12, KIF1B, LZTR1, MAX, MEN1, MET, MLH1, MSH2, MSH3, MSH6, MUTYH, NBN, NF1, NF2, NTHL1, PALB2, PHOX2B, PMS2, POT1, PRKAR1A, PTCH1, PTEN, RAD51C, RAD51D, RB1, RECQL, RET, SDHA, SDHAF2, SDHB, SDHC, SDHD, SMAD4, SMARCA4, SMARCB1, SMARCE1, STK11, SUFU, TMEM127, TP53, TSC1, TSC2, VHL and XRCC2 (sequencing and deletion/duplication); EGFR, EGLN1, HOXB13, KIT, MITF, PDGFRA, POLD1, and POLE (sequencing only); EPCAM and GREM1 (deletion/duplication only).      FAMILY HISTORY:  We obtained a detailed, 4-generation family history.  Significant diagnoses are listed below: Family History  Problem Relation Age of Onset   Lymphoma Brother        dx late 56s   Melanoma Brother        dx after 79   Ovarian cancer Maternal Aunt        dx 22s   Breast cancer Paternal Aunt        dx 78s   Uterine cancer Maternal Aunt        dx 29s      Monica Perry is unaware of previous family history of genetic testing for hereditary cancer risks. Patient's maternal ancestors are of [place] descent, and paternal ancestors are of [place] descent. There is no reported Ashkenazi Jewish ancestry. There is no known consanguinity.  GENETIC TEST RESULTS: Genetic testing reported out on Sep 30, 2020.  The  [facility] CancerNext-Expanded +RNAinsight Panel found no pathogenic mutations. The CancerNext-Expanded gene panel offered by [facility] and includes sequencing, rearrangement, and RNA analysis for the following 77 genes: AIP, ALK, APC, ATM, AXIN2, BAP1, BARD1, BLM, BMPR1A,  BRCA1, BRCA2, BRIP1, CDC73, CDH1, CDK4, CDKN1B, CDKN2A, CHEK2, CTNNA1, DICER1, FANCC, FH, FLCN, GALNT12, KIF1B, LZTR1, MAX, MEN1, MET, MLH1, MSH2, MSH3, MSH6, MUTYH, NBN, NF1, NF2, NTHL1, PALB2, PHOX2B, PMS2, POT1, PRKAR1A, PTCH1, PTEN, RAD51C, RAD51D, RB1, RECQL, RET, SDHA, SDHAF2, SDHB, SDHC, SDHD, SMAD4, SMARCA4, SMARCB1, SMARCE1, STK11, SUFU, TMEM127, TP53, TSC1, TSC2, VHL and XRCC2 (sequencing and deletion/duplication); EGFR, EGLN1, HOXB13, KIT, MITF, PDGFRA, POLD1, and POLE (sequencing only); EPCAM and GREM1 (deletion/duplication only).   The test report has been scanned into EPIC and is located under the Molecular Pathology section of the Results Review tab.  A portion of the result report is included below for reference.     We discussed with Monica Perry that because current genetic testing is not perfect, it is possible there may be a gene mutation in one of these genes that current testing cannot detect, but that chance is small.  We also discussed, that there could be another gene that has not yet been discovered, or that we have not yet tested, that is responsible for the cancer diagnoses in the family. It is also possible there is a hereditary cause for the cancer in the family that Monica Perry did not inherit and therefore was not identified in her testing.  Therefore, it is important to remain in touch with cancer genetics in the future so that we can continue to offer Monica Perry the most up to date genetic testing.   Genetic testing did identify a variant of uncertain significance (VUS) in the PTCH1 gene called c.3616C>T (p.R1206C).  At this time, it is unknown if this variant is associated with increased cancer risk or if this is a normal finding, but most variants such as this get reclassified to being inconsequential. It should not be used to make medical management decisions. With time, we suspect the lab will determine the significance of this variant, if any. If we do learn more  about it, we will try to contact Monica Perry to discuss it further. However, it is important to stay in touch with [place] periodically and keep the address and phone number up to date.  Update: The variant of uncertain significance (VUS) in PTCH1 at p.R1206C has been reclassified to likely benign.  The change in variant classification was made as a result of re-review of evidence in light of new variant interpretation guidelines and/or new information. The amended report date is April 17, 2021.     ADDITIONAL GENETIC TESTING: We discussed with Monica Perry that her genetic testing was fairly extensive.  If there are genes identified to increase cancer risk that can be analyzed in the future, we would be happy to discuss and coordinate this testing at that time.    CANCER SCREENING RECOMMENDATIONS: Monica Perry test result is considered negative (normal).  This means that we have not identified a hereditary cause for her personal history of cancer at this time. Most cancers happen by chance and this negative test suggests that her cancer may fall into this category.    While reassuring, this does not definitively rule out a hereditary predisposition to cancer. It is still possible that there could be genetic mutations that are undetectable by current technology. There could be genetic mutations in genes that have not been tested or  identified to increase cancer risk.  Therefore, it is recommended she continue to follow the cancer management and screening guidelines provided by her oncology and primary healthcare provider.   An individual's cancer risk and medical management are not determined by genetic test results alone. Overall cancer risk assessment incorporates additional factors, including personal medical history, family history, and any available genetic information that may result in a personalized plan for cancer prevention and surveillance  RECOMMENDATIONS FOR FAMILY MEMBERS:  Individuals  in this family might be at some increased risk of developing cancer, over the general population risk, simply due to the family history of cancer.  We recommended women in this family have a yearly mammogram beginning at age 61, or 45 years younger than the earliest onset of cancer, an annual clinical breast exam, and perform monthly breast self-exams. Women in this family should also have a gynecological exam as recommended by their primary provider. Family members should be referred for colonoscopy starting at age 76.   FOLLOW-UP: Lastly, we discussed with Monica Perry that cancer genetics is a rapidly advancing field and it is possible that new genetic tests will be appropriate for her and/or her family members in the future. We encouraged her to remain in contact with cancer genetics on an annual basis so we can update her personal and family histories and let her know of advances in cancer genetics that may benefit this family.   Our contact number was provided. Monica Perry's questions were answered to her satisfaction, and she knows she is welcome to call us at anytime with additional questions or concerns.     Anubis Fundora M. Joette Catching, [place], [facility] Genetic Counselor [email or web address] (P) [phone]

## 2020-10-01 NOTE — Telephone Encounter (Signed)
Revealed negative genetic testing of CancerNext-Expanded +RNAinsight Panel and variant of uncertain significance in PTCH1.  Discussed that we do not know why she has breast cancer or why there is cancer in the family. It could be sporadic/familial, due to a different gene that we are not testing, or maybe our current technology may not be able to pick something up.  It will be important for her to keep in contact with genetics to keep up with whether additional testing may be needed.

## 2020-10-06 ENCOUNTER — Other Ambulatory Visit (HOSPITAL_COMMUNITY)
Admission: RE | Admit: 2020-10-06 | Discharge: 2020-10-06 | Disposition: A | Discharge status: Home / Self Care | Payer: Medicare PPO | Source: Ambulatory Visit | Attending: General Surgery | Admitting: General Surgery

## 2020-10-06 DIAGNOSIS — Z20822 Contact with and (suspected) exposure to covid-19: Secondary | ICD-10-CM | POA: Insufficient documentation

## 2020-10-06 DIAGNOSIS — Z01812 Encounter for preprocedural laboratory examination: Secondary | ICD-10-CM | POA: Insufficient documentation

## 2020-10-06 LAB — SARS CORONAVIRUS 2 (TAT 6-24 HRS): SARS Coronavirus 2: NEGATIVE

## 2020-10-07 DIAGNOSIS — C50811 Malignant neoplasm of overlapping sites of right female breast: Secondary | ICD-10-CM | POA: Diagnosis not present

## 2020-10-07 MED ORDER — ENSURE PRE-SURGERY PO LIQD: 296.0000 mL | Freq: Once | ORAL | Status: DC

## 2020-10-07 NOTE — Progress Notes (Signed)

## 2020-10-08 ENCOUNTER — Ambulatory Visit (HOSPITAL_BASED_OUTPATIENT_CLINIC_OR_DEPARTMENT_OTHER): Payer: Medicare PPO | Admitting: Anesthesiology

## 2020-10-08 ENCOUNTER — Encounter (HOSPITAL_BASED_OUTPATIENT_CLINIC_OR_DEPARTMENT_OTHER)
Admission: RE | Disposition: A | Discharge status: Home / Self Care | Payer: Self-pay | Source: Home / Self Care | Attending: General Surgery

## 2020-10-08 ENCOUNTER — Encounter (HOSPITAL_BASED_OUTPATIENT_CLINIC_OR_DEPARTMENT_OTHER): Payer: Self-pay | Admitting: General Surgery

## 2020-10-08 ENCOUNTER — Ambulatory Visit (HOSPITAL_BASED_OUTPATIENT_CLINIC_OR_DEPARTMENT_OTHER)
Admission: RE | Admit: 2020-10-08 | Discharge: 2020-10-08 | Disposition: A | Discharge status: Home / Self Care | Payer: Medicare PPO | Attending: General Surgery | Admitting: General Surgery

## 2020-10-08 ENCOUNTER — Other Ambulatory Visit: Payer: Self-pay

## 2020-10-08 DIAGNOSIS — E7800 Pure hypercholesterolemia, unspecified: Secondary | ICD-10-CM | POA: Diagnosis not present

## 2020-10-08 DIAGNOSIS — Z808 Family history of malignant neoplasm of other organs or systems: Secondary | ICD-10-CM | POA: Insufficient documentation

## 2020-10-08 DIAGNOSIS — Z8041 Family history of malignant neoplasm of ovary: Secondary | ICD-10-CM | POA: Insufficient documentation

## 2020-10-08 DIAGNOSIS — Z8249 Family history of ischemic heart disease and other diseases of the circulatory system: Secondary | ICD-10-CM | POA: Insufficient documentation

## 2020-10-08 DIAGNOSIS — J309 Allergic rhinitis, unspecified: Secondary | ICD-10-CM | POA: Diagnosis not present

## 2020-10-08 DIAGNOSIS — C50911 Malignant neoplasm of unspecified site of right female breast: Secondary | ICD-10-CM | POA: Diagnosis not present

## 2020-10-08 DIAGNOSIS — Z8542 Personal history of malignant neoplasm of other parts of uterus: Secondary | ICD-10-CM | POA: Diagnosis not present

## 2020-10-08 DIAGNOSIS — D0511 Intraductal carcinoma in situ of right breast: Secondary | ICD-10-CM | POA: Diagnosis not present

## 2020-10-08 DIAGNOSIS — Z170 Estrogen receptor positive status [ER+]: Secondary | ICD-10-CM | POA: Insufficient documentation

## 2020-10-08 DIAGNOSIS — K219 Gastro-esophageal reflux disease without esophagitis: Secondary | ICD-10-CM | POA: Diagnosis not present

## 2020-10-08 DIAGNOSIS — Z8261 Family history of arthritis: Secondary | ICD-10-CM | POA: Insufficient documentation

## 2020-10-08 HISTORY — DX: Unspecified osteoarthritis, unspecified site: M19.90

## 2020-10-08 HISTORY — PX: BREAST LUMPECTOMY WITH RADIOACTIVE SEED LOCALIZATION: SHX6424

## 2020-10-08 SURGERY — BREAST LUMPECTOMY WITH RADIOACTIVE SEED LOCALIZATION
Anesthesia: General | Site: Breast | Laterality: Right

## 2020-10-08 MED ORDER — DEXAMETHASONE SODIUM PHOSPHATE 4 MG/ML IJ SOLN
INTRAMUSCULAR | Status: DC | PRN
  Administered 2020-10-08: 5 mg via INTRAVENOUS

## 2020-10-08 MED ORDER — FENTANYL CITRATE (PF) 100 MCG/2ML IJ SOLN
INTRAMUSCULAR | Status: DC | PRN
  Administered 2020-10-08: 50 ug via INTRAVENOUS
  Administered 2020-10-08 (×2): 25 ug via INTRAVENOUS

## 2020-10-08 MED ORDER — ONDANSETRON HCL 4 MG/2ML IJ SOLN
INTRAMUSCULAR | Status: AC
  Filled 2020-10-08: qty 2

## 2020-10-08 MED ORDER — PROPOFOL 500 MG/50ML IV EMUL
INTRAVENOUS | Status: DC | PRN
  Administered 2020-10-08: 25 ug/kg/min via INTRAVENOUS

## 2020-10-08 MED ORDER — CEFAZOLIN SODIUM-DEXTROSE 2-4 GM/100ML-% IV SOLN
INTRAVENOUS | Status: AC
  Filled 2020-10-08: qty 100

## 2020-10-08 MED ORDER — CEFAZOLIN SODIUM-DEXTROSE 2-4 GM/100ML-% IV SOLN
2.0000 g | INTRAVENOUS | Status: AC
  Administered 2020-10-08: 2 g via INTRAVENOUS

## 2020-10-08 MED ORDER — LACTATED RINGERS IV SOLN: INTRAVENOUS | Status: DC

## 2020-10-08 MED ORDER — LIDOCAINE 2% (20 MG/ML) 5 ML SYRINGE
INTRAMUSCULAR | Status: DC | PRN
  Administered 2020-10-08: 60 mg via INTRAVENOUS

## 2020-10-08 MED ORDER — OXYCODONE HCL 5 MG PO TABS: 5.0000 mg | ORAL_TABLET | Freq: Once | ORAL | Status: DC | PRN

## 2020-10-08 MED ORDER — DEXAMETHASONE SODIUM PHOSPHATE 10 MG/ML IJ SOLN
INTRAMUSCULAR | Status: AC
  Filled 2020-10-08: qty 1

## 2020-10-08 MED ORDER — SUCCINYLCHOLINE CHLORIDE 20 MG/ML IJ SOLN
INTRAMUSCULAR | Status: DC | PRN
  Administered 2020-10-08: 40 mg via INTRAVENOUS

## 2020-10-08 MED ORDER — ONDANSETRON HCL 4 MG/2ML IJ SOLN: 4.0000 mg | Freq: Once | INTRAMUSCULAR | Status: DC | PRN

## 2020-10-08 MED ORDER — FENTANYL CITRATE (PF) 100 MCG/2ML IJ SOLN
INTRAMUSCULAR | Status: AC
  Filled 2020-10-08: qty 2

## 2020-10-08 MED ORDER — ONDANSETRON HCL 4 MG/2ML IJ SOLN
INTRAMUSCULAR | Status: DC | PRN
  Administered 2020-10-08: 4 mg via INTRAVENOUS

## 2020-10-08 MED ORDER — PROPOFOL 10 MG/ML IV BOLUS
INTRAVENOUS | Status: AC
  Filled 2020-10-08: qty 20

## 2020-10-08 MED ORDER — PROPOFOL 10 MG/ML IV BOLUS
INTRAVENOUS | Status: DC | PRN
  Administered 2020-10-08: 50 mg via INTRAVENOUS
  Administered 2020-10-08: 150 mg via INTRAVENOUS

## 2020-10-08 MED ORDER — FENTANYL CITRATE (PF) 100 MCG/2ML IJ SOLN: 25.0000 ug | INTRAMUSCULAR | Status: DC | PRN

## 2020-10-08 MED ORDER — MIDAZOLAM HCL 5 MG/5ML IJ SOLN
INTRAMUSCULAR | Status: DC | PRN
  Administered 2020-10-08: 2 mg via INTRAVENOUS

## 2020-10-08 MED ORDER — BUPIVACAINE HCL (PF) 0.25 % IJ SOLN
INTRAMUSCULAR | Status: DC | PRN
  Administered 2020-10-08: 10 mL

## 2020-10-08 MED ORDER — OXYCODONE HCL 5 MG/5ML PO SOLN: 5.0000 mg | Freq: Once | ORAL | Status: DC | PRN

## 2020-10-08 MED ORDER — MIDAZOLAM HCL 2 MG/2ML IJ SOLN
INTRAMUSCULAR | Status: AC
  Filled 2020-10-08: qty 2

## 2020-10-08 SURGICAL SUPPLY — 57 items
ADH SKN CLS APL DERMABOND .7 (GAUZE/BANDAGES/DRESSINGS) ×1
APL PRP STRL LF DISP 70% ISPRP (MISCELLANEOUS) ×1
APPLIER CLIP 9.375 MED OPEN (MISCELLANEOUS)
APR CLP MED 9.3 20 MLT OPN (MISCELLANEOUS)
BINDER BREAST LRG (GAUZE/BANDAGES/DRESSINGS) IMPLANT
BINDER BREAST MEDIUM (GAUZE/BANDAGES/DRESSINGS) IMPLANT
BINDER BREAST XLRG (GAUZE/BANDAGES/DRESSINGS) ×1 IMPLANT
BINDER BREAST XXLRG (GAUZE/BANDAGES/DRESSINGS) IMPLANT
BLADE SURG 15 STRL LF DISP TIS (BLADE) ×1 IMPLANT
BLADE SURG 15 STRL SS (BLADE) ×2
CANISTER SUC SOCK COL 7IN (MISCELLANEOUS) IMPLANT
CANISTER SUCT 1200ML W/VALVE (MISCELLANEOUS) IMPLANT
CHLORAPREP W/TINT 26 (MISCELLANEOUS) ×2 IMPLANT
CLIP APPLIE 9.375 MED OPEN (MISCELLANEOUS) IMPLANT
CLIP VESOCCLUDE SM WIDE 6/CT (CLIP) IMPLANT
COVER BACK TABLE 60X90IN (DRAPES) ×2 IMPLANT
COVER MAYO STAND STRL (DRAPES) ×2 IMPLANT
COVER PROBE W GEL 5X96 (DRAPES) ×1 IMPLANT
COVER WAND RF STERILE (DRAPES) IMPLANT
DECANTER SPIKE VIAL GLASS SM (MISCELLANEOUS) IMPLANT
DERMABOND ADVANCED (GAUZE/BANDAGES/DRESSINGS) ×1
DERMABOND ADVANCED .7 DNX12 (GAUZE/BANDAGES/DRESSINGS) ×1 IMPLANT
DRAPE LAPAROSCOPIC ABDOMINAL (DRAPES) ×2 IMPLANT
DRAPE UTILITY XL STRL (DRAPES) ×2 IMPLANT
DRSG TEGADERM 4X4.75 (GAUZE/BANDAGES/DRESSINGS) IMPLANT
ELECT COATED BLADE 2.86 ST (ELECTRODE) ×1 IMPLANT
ELECT REM PT RETURN 9FT ADLT (ELECTROSURGICAL) ×2
ELECTRODE REM PT RTRN 9FT ADLT (ELECTROSURGICAL) ×1 IMPLANT
GAUZE SPONGE 4X4 12PLY STRL LF (GAUZE/BANDAGES/DRESSINGS) IMPLANT
GLOVE SURG ENC MOIS LTX SZ7 (GLOVE) ×4 IMPLANT
GLOVE SURG UNDER POLY LF SZ7.5 (GLOVE) ×2 IMPLANT
GOWN STRL REUS W/ TWL LRG LVL3 (GOWN DISPOSABLE) ×2 IMPLANT
GOWN STRL REUS W/TWL LRG LVL3 (GOWN DISPOSABLE) ×4
HEMOSTAT ARISTA ABSORB 3G PWDR (HEMOSTASIS) IMPLANT
KIT MARKER MARGIN INK (KITS) ×2 IMPLANT
NDL HYPO 25X1 1.5 SAFETY (NEEDLE) ×1 IMPLANT
NEEDLE HYPO 25X1 1.5 SAFETY (NEEDLE) ×2 IMPLANT
NS IRRIG 1000ML POUR BTL (IV SOLUTION) ×1 IMPLANT
PACK BASIN DAY SURGERY FS (CUSTOM PROCEDURE TRAY) ×2 IMPLANT
PENCIL SMOKE EVACUATOR (MISCELLANEOUS) ×2 IMPLANT
RETRACTOR ONETRAX LX 90X20 (MISCELLANEOUS) IMPLANT
SLEEVE SCD COMPRESS KNEE MED (STOCKING) ×2 IMPLANT
SPONGE LAP 4X18 RFD (DISPOSABLE) ×2 IMPLANT
STRIP CLOSURE SKIN 1/2X4 (GAUZE/BANDAGES/DRESSINGS) ×2 IMPLANT
SUT MNCRL AB 4-0 PS2 18 (SUTURE) ×1 IMPLANT
SUT MON AB 5-0 PS2 18 (SUTURE) ×1 IMPLANT
SUT SILK 2 0 SH (SUTURE) ×1 IMPLANT
SUT VIC AB 2-0 SH 27 (SUTURE) ×2
SUT VIC AB 2-0 SH 27XBRD (SUTURE) ×1 IMPLANT
SUT VIC AB 3-0 SH 27 (SUTURE) ×2
SUT VIC AB 3-0 SH 27X BRD (SUTURE) ×1 IMPLANT
SUT VIC AB 5-0 PS2 18 (SUTURE) IMPLANT
SYR CONTROL 10ML LL (SYRINGE) ×2 IMPLANT
TOWEL GREEN STERILE FF (TOWEL DISPOSABLE) ×2 IMPLANT
TRAY FAXITRON CT DISP (TRAY / TRAY PROCEDURE) ×2 IMPLANT
TUBE CONNECTING 20X1/4 (TUBING) IMPLANT
YANKAUER SUCT BULB TIP NO VENT (SUCTIONS) IMPLANT

## 2020-10-08 NOTE — H&P (Signed)
65 yof with positive family history of ovarian cancer/bca who underwent screening mm. she has b density breasts. there were 1.6 cm of calcifications on the right. the calcs are 1-2 cm from nipple. 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 she has no prior breast history, she has no mass or dc. 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 she has prior uterine cancer in2009 treated with tah/bso alone and has done well.   Past Surgical History  Hysterectomy (due to cancer) - Complete   Diagnostic Studies History Colonoscopy  1-5 years ago Mammogram  within last year Pap Smear  >5 years ago  Medication History  Medications Reconciled  Social History  Caffeine use  Coffee, Tea. No alcohol use  No drug use  Tobacco use  Never smoker.  Family History  Arthritis  Mother. Cancer  Brother, Family Members In General. Heart Disease  Father. Melanoma  Brother. Ovarian Cancer  Family Members In General.  Pregnancy / Birth History  Age at menarche  66 years. Age of menopause  31-55 Gravida  1 Length (months) of breastfeeding  7-12 Maternal age  67-30 Para  2  Other Problems  Arthritis  Cancer  Hypercholesterolemia    Review of Systems  General Not Present- Appetite Loss, Chills, Fatigue, Fever, Night Sweats, Weight Gain and Weight Loss. Skin Not Present- Change in Wart/Mole, Dryness, Hives, Jaundice, New Lesions, Non-Healing Wounds, Rash and Ulcer. HEENT Not Present- Earache, Hearing Loss, Hoarseness, Nose Bleed, Oral Ulcers, Ringing in the Ears, Seasonal Allergies, Sinus Pain, Sore Throat, Visual Disturbances, Wears glasses/contact lenses and Yellow Eyes. Respiratory Not Present- Bloody sputum, Chronic Cough, Difficulty Breathing, Snoring and Wheezing. Breast Not Present- Breast Mass, Breast Pain, Nipple Discharge and Skin Changes. Cardiovascular Not Present- Chest Pain, Difficulty Breathing Lying Down, Leg Cramps, Palpitations, Rapid Heart Rate,  Shortness of Breath and Swelling of Extremities. Gastrointestinal Not Present- Abdominal Pain, Bloating, Bloody Stool, Change in Bowel Habits, Chronic diarrhea, Constipation, Difficulty Swallowing, Excessive gas, Gets full quickly at meals, Hemorrhoids, Indigestion, Nausea, Rectal Pain and Vomiting. Female Genitourinary Not Present- Frequency, Nocturia, Painful Urination, Pelvic Pain and Urgency. Musculoskeletal Not Present- Back Pain, Joint Pain, Joint Stiffness, Muscle Pain, Muscle Weakness and Swelling of Extremities. Neurological Not Present- Decreased Memory, Fainting, Headaches, Numbness, Seizures, Tingling, Tremor, Trouble walking and Weakness. Psychiatric Not Present- Anxiety, Bipolar, Change in Sleep Pattern, Depression, Fearful and Frequent crying. Endocrine Not Present- Cold Intolerance, Excessive Hunger, Hair Changes, Heat Intolerance, Hot flashes and New Diabetes. Hematology Not Present- Blood Thinners, Easy Bruising, Excessive bleeding, Gland problems, HIV and Persistent Infections.   Physical Exam  General Mental Status-Alert. Orientation-Oriented X3. Breast Nipples-No Discharge. Breast Lump-No Palpable Breast Mass. Lymphatic Head & Neck General Head & Neck Lymphatics: Bilateral - Description - Normal. Axillary General Axillary Region: Bilateral - Description - Normal. Note: no [place] adenopathy   Assessment & Plan  BREAST NEOPLASM, TIS (DCIS), RIGHT (D05.11) Story: Right breast seed guided lumpectomy We discussed the staging and pathophysiology of breast cancer. We discussed all of the different options for treatment for breast cancer including surgery, chemotherapy, radiation therapy, Herceptin, and antiestrogen therapy. She will not need a sentinel node biopsy We discussed the options for treatment of the breast cancer which included lumpectomy versus a mastectomy. We discussed the performance of the lumpectomy with radioactive seed placement. We discussed a 5-10%  chance of a positive margin requiring reexcision in the operating room. We also discussed that she will likely need radiation therapy if she undergoes lumpectomy. We  discussed mastectomy and the postoperative care for that as well. Mastectomy can be followed by reconstruction. The decision for lumpectomy vs mastectomy has no impact on decision for chemotherapy. Most mastectomy patients will not need radiation therapy. We discussed that there is no difference in her survival whether she undergoes lumpectomy with radiation therapy or antiestrogen therapy versus a mastectomy. There is also no real difference between her recurrence in the breast. We discussed the risks of operation including bleeding, infection, possible reoperation. She understands her further therapy will be based on what her stages at the time of her operation.

## 2020-10-08 NOTE — Anesthesia Preprocedure Evaluation (Addendum)
Anesthesia Evaluation  Patient identified by MRN, date of birth, ID band Patient awake    Reviewed: Allergy & Precautions, NPO status , Patient's Chart, lab work & pertinent test results  Airway Mallampati: II  TM Distance: >3 FB Neck ROM: Full    Dental no notable dental hx. (+) Teeth Intact, Dental Advisory Given   Pulmonary Recent URI , Resolved,    Pulmonary exam normal breath sounds clear to auscultation       Cardiovascular negative cardio ROS Normal cardiovascular exam Rhythm:Regular Rate:Normal     Neuro/Psych negative neurological ROS  negative psych ROS   GI/Hepatic Neg liver ROS, GERD  Medicated,  Endo/Other  DCIS right breast  Renal/GU negative Renal ROS  negative genitourinary   Musculoskeletal  (+) Arthritis , Osteoarthritis,    Abdominal (+) + obese,   Peds  Hematology negative hematology ROS (+)   Anesthesia Other Findings   Reproductive/Obstetrics Hx/o endometrial Ca S/P hysterectomy                            Anesthesia Physical Anesthesia Plan  ASA: II  Anesthesia Plan: General   Post-op Pain Management:    Induction: Intravenous  PONV Risk Score and Plan: Treatment may vary due to age or medical condition, Scopolamine patch - Pre-op, Ondansetron and Dexamethasone  Airway Management Planned: LMA  Additional Equipment:   Intra-op Plan:   Post-operative Plan: Extubation in OR  Informed Consent: I have reviewed the patients History and Physical, chart, labs and discussed the procedure including the risks, benefits and alternatives for the proposed anesthesia with the patient or authorized representative who has indicated his/her understanding and acceptance.     Dental advisory given  Plan Discussed with: CRNA and Anesthesiologist  Anesthesia Plan Comments:        Anesthesia Quick Evaluation

## 2020-10-08 NOTE — Discharge Instructions (Signed)
[facility] Office Phone Number [phone]  BREAST BIOPSY/ PARTIAL MASTECTOMY: POST OP INSTRUCTIONS Take 400 mg of ibuprofen every 8 hours or 650 mg tylenol every 6 hours for next 72 hours then as needed. Use ice several times daily also. Always review your discharge instruction sheet given to you by the facility where your surgery was performed.  IF YOU HAVE DISABILITY OR FAMILY LEAVE FORMS, YOU MUST BRING THEM TO THE OFFICE FOR PROCESSING.  DO NOT GIVE THEM TO YOUR DOCTOR.  1. A prescription for pain medication may be given to you upon discharge.  Take your pain medication as prescribed, if needed.  If narcotic pain medicine is not needed, then you may take acetaminophen (Tylenol), naprosyn (Alleve) or ibuprofen (Advil) as needed. 2. Take your usually prescribed medications unless otherwise directed 3. If you need a refill on your pain medication, please contact your pharmacy.  They will contact our office to request authorization.  Prescriptions will not be filled after 5pm or on week-ends. 4. You should eat very light the first 24 hours after surgery, such as soup, crackers, pudding, etc.  Resume your normal diet the day after surgery. 5. Most patients will experience some swelling and bruising in the breast.  Ice packs and a good support bra will help.  Wear the breast binder provided or a sports bra for 72 hours day and night.  After that wear a sports bra during the day until you return to the office. Swelling and bruising can take several days to resolve.  6. It is common to experience some constipation if taking pain medication after surgery.  Increasing fluid intake and taking a stool softener will usually help or prevent this problem from occurring.  A mild laxative (Milk of Magnesia or Miralax) should be taken according to package directions if there are no bowel movements after 48 hours. 7. Unless discharge instructions indicate otherwise, you may remove your bandages 48  hours after surgery and you may shower at that time.  You may have steri-strips (small skin tapes) in place directly over the incision.  These strips should be left on the skin for 7-10 days and will come off on their own.  If your surgeon used skin glue on the incision, you may shower in 24 hours.  The glue will flake off over the next 2-3 weeks.  Any sutures or staples will be removed at the office during your follow-up visit. 8. ACTIVITIES:  You may resume regular daily activities (gradually increasing) beginning the next day.  Wearing a good support bra or sports bra minimizes pain and swelling.  You may have sexual intercourse when it is comfortable. a. You may drive when you no longer are taking prescription pain medication, you can comfortably wear a seatbelt, and you can safely maneuver your car and apply brakes. b. RETURN TO WORK:  ______________________________________________________________________________________ 9. You should see your doctor in the office for a follow-up appointment approximately two weeks after your surgery.  Your doctor's nurse will typically make your follow-up appointment when she calls you with your pathology report.  Expect your pathology report 3-4 business days after your surgery.  You may call to check if you do not hear from us after three days. 10. OTHER INSTRUCTIONS: _______________________________________________________________________________________________ _____________________________________________________________________________________________________________________________________ _____________________________________________________________________________________________________________________________________ _____________________________________________________________________________________________________________________________________  WHEN TO CALL DR WAKEFIELD: 1. Fever over 101.0 2. Nausea and/or vomiting. 3. Extreme swelling or  bruising. 4. Continued bleeding from incision. 5. Increased pain, redness, or drainage from the incision.  The clinic   staff is available to answer your questions during regular business hours.  Please don't hesitate to call and ask to speak to one of the nurses for clinical concerns.  If you have a medical emergency, go to the nearest emergency room or call 911.  A surgeon from [facility] is always on call at the hospital.  For further questions, please visit [email or web address]   Post Anesthesia Home Care Instructions  Activity: Get plenty of rest for the remainder of the day. A responsible individual must stay with you for 24 hours following the procedure.  For the next 24 hours, DO NOT: -Drive a car -Paediatric nurse -Drink alcoholic beverages -Take any medication unless instructed by your physician -Make any legal decisions or sign important papers.  Meals: Start with liquid foods such as gelatin or soup. Progress to regular foods as tolerated. Avoid greasy, spicy, heavy foods. If nausea and/or vomiting occur, drink only clear liquids until the nausea and/or vomiting subsides. Call your physician if vomiting continues.  Special Instructions/Symptoms: Your throat may feel dry or sore from the anesthesia or the breathing tube placed in your throat during surgery. If this causes discomfort, gargle with warm salt water. The discomfort should disappear within 24 hours.  If you had a scopolamine patch placed behind your ear for the management of post- operative nausea and/or vomiting:  1. The medication in the patch is effective for 72 hours, after which it should be removed.  Wrap patch in a tissue and discard in the trash. Wash hands thoroughly with soap and water. 2. You may remove the patch earlier than 72 hours if you experience unpleasant side effects which may include dry mouth, dizziness or visual disturbances. 3. Avoid touching the patch. Wash your hands  with soap and water after contact with the patch.

## 2020-10-08 NOTE — Anesthesia Postprocedure Evaluation (Signed)
Anesthesia Post Note  Patient: Monica Perry  Procedure(s) Performed: RIGHT BREAST LUMPECTOMY WITH RADIOACTIVE SEED LOCALIZATION (Right Breast)     Patient location during evaluation: PACU Anesthesia Type: General Level of consciousness: awake and alert and oriented Pain management: pain level controlled Vital Signs Assessment: post-procedure vital signs reviewed and stable Respiratory status: spontaneous breathing, nonlabored ventilation and respiratory function stable Cardiovascular status: blood pressure returned to baseline and stable Postop Assessment: no apparent nausea or vomiting Anesthetic complications: no   No complications documented.  Last Vitals:  Vitals:   10/08/20 1315 10/08/20 1330  BP: (!) 145/77 137/84  Pulse: 87 88  Resp: 14 14  Temp:    SpO2: 100% 98%    Last Pain:  Vitals:   10/08/20 1315  TempSrc:   PainSc: Asleep                 Xaniyah Buchholz A.

## 2020-10-08 NOTE — Op Note (Signed)
Preoperative diagnosis: Right breast dcis Postoperative diagnosis: Same as above Procedure: Right breast radioactive seed guided lumpectomy Surgeon: Dr. Serita Grammes Anesthesia: General Estimated blood loss: Minimal Specimens: Right breast tissue marked with paint containing seed and clip Additional lateral,medial, inferior and posterior margins marked short superior, long lateral double deep Complications: None Drains: None Special count was correct at completion Disposition to recovery stable condition  Indications: 29 yof with positive family history of ovarian cancer/bca who underwent screening mm. she has b density breasts. there were 1.6 cm of calcifications on the right. the calcs are 1-2 cm from nipple. 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 We elected to proceed with seed guided lumpectomy   Procedure: After informed consent was obtained the patient was taken to the operating room.  She had been given antibiotics.  SCDs were in place.  She was placed under general anesthesia without complication.  She was prepped and draped in the standard sterile surgical fashion.  A surgical timeout was then performed.  I located the seed in the central lateral breast.  I had the mammograms available for my review.  I then infiltrated Marcaine and made a periareolar incision in order to hide the scar later.  I then dissected to the seed. I removed the seed and some of the surrounding tissue with an attempt to get a clear margin.  This was marked with paint.  Mammogram confirmed removal of the clip and the seed. I thought several margins might be close on 3D imaging and I removed additional margins.   I then proceeded to obtain hemostasis.  I closed the breast tissue with 2-0 Vicryl.  The skin was closed with 3-0 Vicryl and 5-0 Monocryl.  Glue and Steri-Strips were applied.  She tolerated this well was extubated and transferred to recovery stable.

## 2020-10-08 NOTE — Transfer of Care (Signed)
Immediate Anesthesia Transfer of Care Note  Patient: Monica Perry  Procedure(s) Performed: RIGHT BREAST LUMPECTOMY WITH RADIOACTIVE SEED LOCALIZATION (Right Breast)  Patient Location: PACU  Anesthesia Type:General  Level of Consciousness: sedated  Airway & Oxygen Therapy: Patient Spontanous Breathing and Patient connected to face mask oxygen  Post-op Assessment: Report given to RN and Post -op Vital signs reviewed and stable  Post vital signs: Reviewed and stable  Last Vitals:  Vitals Value Taken Time  BP 145/87 10/08/20 1306  Temp    Pulse 89 10/08/20 1307  Resp 13 10/08/20 1307  SpO2 99 % 10/08/20 1307  Vitals shown include unvalidated device data.  Last Pain:  Vitals:   10/08/20 1035  TempSrc: Oral  PainSc: 0-No pain         Complications: No complications documented.

## 2020-10-08 NOTE — Interval H&P Note (Signed)
History and Physical Interval Note:  10/08/2020 11:44 AM  Monica Perry  has presented today for surgery, with the diagnosis of RIGHT BREAST CANCER.  The various methods of treatment have been discussed with the patient and family. After consideration of risks, benefits and other options for treatment, the patient has consented to  Procedure(s): RIGHT BREAST LUMPECTOMY WITH RADIOACTIVE SEED LOCALIZATION (Right) as a surgical intervention.  The patient's history has been reviewed, patient examined, no change in status, stable for surgery.  I have reviewed the patient's chart and labs.  Questions were answered to the patient's satisfaction.     Rolm Bookbinder

## 2020-10-08 NOTE — Anesthesia Procedure Notes (Signed)
Procedure Name: LMA Insertion Date/Time: 10/08/2020 12:14 PM Performed by: Maryella Shivers, CRNA Pre-anesthesia Checklist: Patient identified, Emergency Drugs available, Suction available and Patient being monitored Patient Re-evaluated:Patient Re-evaluated prior to induction Oxygen Delivery Method: Circle system utilized Preoxygenation: Pre-oxygenation with 100% oxygen Induction Type: IV induction Ventilation: Mask ventilation without difficulty LMA: LMA inserted LMA Size: 4.0 Number of attempts: 2 Airway Equipment and Method: Bite block Placement Confirmation: positive ETCO2 Tube secured with: Tape Dental Injury: Teeth and Oropharynx as per pre-operative assessment

## 2020-10-09 ENCOUNTER — Encounter (HOSPITAL_BASED_OUTPATIENT_CLINIC_OR_DEPARTMENT_OTHER): Payer: Self-pay | Admitting: General Surgery

## 2020-10-10 LAB — SURGICAL PATHOLOGY

## 2020-10-13 ENCOUNTER — Encounter: Payer: Self-pay | Admitting: *Deleted

## 2020-10-14 ENCOUNTER — Telehealth: Payer: Self-pay | Admitting: *Deleted

## 2020-10-14 ENCOUNTER — Ambulatory Visit: Payer: Medicare PPO | Admitting: Hematology and Oncology

## 2020-10-14 NOTE — Telephone Encounter (Signed)
Received vm call from pt & call returned.  She wants to know what the reason is for her appt today.  She states she received pathology report from Dr [name] & margins are clear.  She reports that if she still needs to come in will need to r/s today's appt.  Discussed with Dr Lindi Adie & he is OK to cancel but wants to see her post radiation.  Pt notified & Dawn Nicole Kindred was notified.

## 2020-10-14 NOTE — Assessment & Plan Note (Deleted)
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   Treatment plan: 1.  Right breast conserving surgery 10/08/2020: High-grade DCIS with necrosis, margins negative, ER 90%, PR 35% 2. Followed by adjuvant radiation therapy 3. Followed by antiestrogen therapy with tamoxifen 5 years ----------------------------------------------------------------- Pathology counseling: I discussed the final pathology report of the patient provided  a copy of this report. I discussed the margins.  We also discussed the final staging along with previously performed ER/PR testing.  Return to clinic after radiation is complete

## 2020-10-16 ENCOUNTER — Inpatient Hospital Stay: Payer: Medicare PPO | Admitting: Hematology and Oncology

## 2020-10-29 DIAGNOSIS — H35372 Puckering of macula, left eye: Secondary | ICD-10-CM | POA: Diagnosis not present

## 2020-10-29 DIAGNOSIS — H2513 Age-related nuclear cataract, bilateral: Secondary | ICD-10-CM | POA: Diagnosis not present

## 2020-10-29 DIAGNOSIS — H43813 Vitreous degeneration, bilateral: Secondary | ICD-10-CM | POA: Diagnosis not present

## 2020-10-29 DIAGNOSIS — H40033 Anatomical narrow angle, bilateral: Secondary | ICD-10-CM | POA: Diagnosis not present

## 2020-11-04 NOTE — Progress Notes (Signed)
Location of Breast Cancer: right breast  Histology per Pathology Report:  10/08/2020   Receptor Status: ER 80%, PR 5%, HER2: Not Assessed  Did patient present with symptoms (if so, please note symptoms) or was this found on screening mammography?:  Screening mammogram on 08/04/20 showed a 1.6cm group of calcifications at the 9 o'clock position in the right breast. Diagnostic mammogram and [place] on 08/14/20 showed the 1.6cm group of calcifications.  Past/Anticipated interventions by surgeon, if any:  10/08/2020 Procedure: Right breast radioactive seed guided lumpectomy Surgeon: Dr. Serita Grammes  Past/Anticipated interventions by medical oncology, if any: Dr Lindi Adie   Lymphedema issues, if any:  no    Pain issues, if any:  no   SAFETY ISSUES: Prior radiation? no Pacemaker/ICD? no Possible current pregnancy?no, hysterectomy Is the patient on methotrexate? no  Current Complaints / other details:  none     Vitals:   11/10/20 1230  BP: (!) 147/84  Pulse: 84  Resp: 20  Temp: (!) 97 F (36.1 C)  TempSrc: Temporal  SpO2: 99%  Weight: 189 lb 6 oz (85.9 kg)  Height: $Remove'5\' 4"'[id number]$  (1.626 m)

## 2020-11-06 NOTE — Progress Notes (Signed)
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 A diagnosis of Ductal carcinoma in situ (DCIS) of right breast was also pertinent to this visit.  Stage 0 Right Breast UOQ, DCIS, ER + / PR + /, Grade 3    HISTORY OF PRESENT ILLNESS::Monica Perry is a 65 y.o. female who is accompanied by identical twin sister. she is seen as a courtesy of Dr. Lindi Adie  for an opinion concerning radiation therapy as part of management for her recently diagnosed right breast cancer. The patient presented for a routine screening mammography on 08/04/20 showing a possible abnormality in the right breast. She underwent bilateral diagnostic mammography with tomography at [facility] on 08/14/20 showing: 1.6 cm group of linear branching calcification at 9 o'clock.   Biopsy on 08/28/20 showed: DCIS, high grade. Prognostic indicators significant for: estrogen receptor, 90% positive and progesterone receptor, 35% positive , both with moderate / strong staining intensity.    The patient received genetic testing; results received on 09/17/20 were negative.   On 10/08/20, the patient underwent right breast lumpectomy which revealed: high grade DCIS with necrosis, with final resection margins negative for carcinoma. Prognostic indicators significant for: ER 90% positive with strong staining intensity, and PR 35% with moderate staining intensity.  Surgical margins were greater than 1 cm  She was initially seen in the multidisciplinary breast clinic    PAST MEDICAL HISTORY:  Past Medical History:  Diagnosis Date   Allergy    Arthritis    knees   Breast cancer ([place]) 08/2020   right breast DCIS   Elevated cholesterol    Endometrial  carcinoma ([place])    Family history of breast cancer 09/10/2020   Family history of melanoma 09/10/2020   Family history of ovarian cancer 09/10/2020   GERD (gastroesophageal reflux disease)    History of endometrial cancer 09/10/2020    PAST SURGICAL HISTORY: Past Surgical History:  Procedure Laterality Date   ABDOMINAL HYSTERECTOMY     BREAST LUMPECTOMY WITH RADIOACTIVE SEED LOCALIZATION Right 10/08/2020   Procedure: RIGHT BREAST LUMPECTOMY WITH RADIOACTIVE SEED LOCALIZATION;  Surgeon: Rolm Bookbinder, MD;  Location: [facility];  Service: General;  Laterality: Right;   CESAREAN SECTION      FAMILY HISTORY:  Family History  Problem Relation Age of Onset   Lymphoma Brother        dx late 64s   Melanoma Brother        dx after 15   Ovarian cancer Maternal Aunt        dx 60s   Breast cancer Paternal Aunt        dx 69s   Uterine cancer Maternal Aunt        dx 36s    SOCIAL HISTORY:  Social History   Tobacco Use   Smoking status: Never   Smokeless tobacco: Never  Substance Use Topics   Alcohol use: No   Drug use: No    ALLERGIES:  Allergies  Allergen Reactions   Aspirin Swelling    Just only the orange coating aspirin   Clarithromycin    Fluticasone-Salmeterol Swelling    Lips swell Lips swell    Ibuprofen Swelling    Lips swell Lips  swell    Red Dye    Septra [Sulfamethoxazole-Trimethoprim]    Stevia Glycerite Extract [Flavoring Agent]    Sulfa Antibiotics Itching   Sulfonamide Derivatives     MEDICATIONS:  Current Outpatient Medications  Medication Sig Dispense Refill   Biotin 5 MG CAPS Take by mouth.     calcium carbonate (TUMS - DOSED IN MG ELEMENTAL CALCIUM) 500 MG chewable tablet Chew 1 tablet by mouth daily.     cholecalciferol (VITAMIN D3) 25 MCG (1000 UNIT) tablet Take 1,000 Units by mouth daily.     fish oil-omega-3 fatty acids 1000 MG capsule Take 2 g by mouth daily.     Multiple Vitamins-Minerals (MULTIVITAMIN WITH MINERALS)  tablet Take 1 tablet by mouth daily.     pravastatin (PRAVACHOL) 20 MG tablet Take 1 tablet by mouth daily.     No current facility-administered medications for this encounter.    REVIEW OF SYSTEMS:  A 10+ POINT REVIEW OF SYSTEMS WAS OBTAINED including neurology, dermatology, psychiatry, cardiac, respiratory, lymph, extremities, GI, GU, musculoskeletal, constitutional, reproductive, HEENT.  She denies any significant pain in the right breast nipple discharge or bleeding.  She denies any problems with range of motion in her right arm.   PHYSICAL EXAM:  height is 5\' 4"  (1.626 m) and weight is 189 lb 6 oz (85.9 kg). Her temporal temperature is 97 F (36.1 C) (abnormal). Her blood pressure is 147/84 (abnormal) and her pulse is 84. Her respiration is 20 and oxygen saturation is 99%.   General: Alert and oriented, in no acute distress HEENT: Head is normocephalic. Extraocular movements are intact. Oropharynx is clear. Neck: Neck is supple, no palpable cervical or supraclavicular lymphadenopathy. Heart: Regular in rate and rhythm with no murmurs, rubs, or gallops. Chest: Clear to auscultation bilaterally, with no rhonchi, wheezes, or rales. Abdomen: Soft, nontender, nondistended, with no rigidity or guarding. Extremities: No cyanosis or edema. Lymphatics: see Neck Exam Skin: No concerning lesions. Musculoskeletal: symmetric strength and muscle tone throughout. Neurologic: Cranial nerves II through XII are grossly intact. No obvious focalities. Speech is fluent. Coordination is intact. Psychiatric: Judgment and insight are intact. Affect is appropriate. The right breast area shows a well-healed periareolar lumpectomy scar in the lateral aspect.  No significant swelling noted in the breast no dominant mass appreciated in the breast nipple discharge or bleeding.  No signs of infection in the breast.    LABORATORY DATA:  Lab Results  Component Value Date   WBC 7.2 09/10/2020   HGB 14.3 09/10/2020    HCT 42.8 09/10/2020   MCV 88.2 09/10/2020   PLT 260 09/10/2020   NEUTROABS 5.5 09/10/2020   Lab Results  Component Value Date   NA 142 09/10/2020   K 3.9 09/10/2020   CL 105 09/10/2020   CO2 26 09/10/2020   GLUCOSE 102 (H) 09/10/2020   CREATININE 0.73 09/10/2020   CALCIUM 10.4 (H) 09/10/2020      RADIOGRAPHY: No results found.    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  Stage 0 Right Breast UOQ, DCIS, ER + / PR + /, Grade 3  The patient would be a good candidate for adjuvant radiation therapy directed to the right breast.  She would also appear to be a good candidate for hypofractionated accelerated radiation therapy over approximately 3 weeks.  Patient's lumpectomy specimen showed greater than 1 cm margins and therefore I did not feel a boost to the lumpectomy cavity is needed.  Today, I  talked to the patient and family about the findings and work-up thus far.  We discussed the natural history of high-grade ductal carcinoma in situ of the breast and general treatment, highlighting the role of radiotherapy in the management.  We discussed the available radiation techniques, and focused on the details of logistics and delivery.  We reviewed the anticipated acute and late sequelae associated with radiation in this setting.  The patient was encouraged to ask questions that I answered to the best of my ability.  A patient consent form was discussed and signed.  We retained a copy for our records.  The patient would like to proceed with radiation and will be scheduled for CT simulation.  PLAN: She will proceed with CT simulation later today with treatments to begin in approximately a week.  Anticipate 16 treatments directed to the right breast.   35 minutes of total time was spent for this patient encounter, including preparation, face-to-face counseling with the patient and coordination of care, physical exam, and documentation of the  encounter.   ------------------------------------------------  Blair Promise, PhD, MD  This document serves as a record of services personally performed by Gery Pray, MD. It was created on his behalf by Roney Mans, a trained medical scribe. The creation of this record is based on the scribe's personal observations and the provider's statements to them. This document has been checked and approved by the attending provider.

## 2020-11-10 ENCOUNTER — Ambulatory Visit
Admission: RE | Admit: 2020-11-10 | Discharge: 2020-11-10 | Disposition: A | Discharge status: Home / Self Care | Payer: Medicare PPO | Source: Ambulatory Visit | Attending: Radiation Oncology | Admitting: Radiation Oncology

## 2020-11-10 ENCOUNTER — Encounter: Payer: Self-pay | Admitting: Radiation Oncology

## 2020-11-10 ENCOUNTER — Other Ambulatory Visit: Payer: Self-pay

## 2020-11-10 VITALS — BP 147/84 | HR 84 | Temp 97.0°F | Resp 20 | Ht 64.0 in | Wt 189.4 lb

## 2020-11-10 DIAGNOSIS — Z8542 Personal history of malignant neoplasm of other parts of uterus: Secondary | ICD-10-CM

## 2020-11-10 DIAGNOSIS — Z510 Encounter for antineoplastic radiation therapy: Secondary | ICD-10-CM | POA: Insufficient documentation

## 2020-11-10 DIAGNOSIS — E7800 Pure hypercholesterolemia, unspecified: Secondary | ICD-10-CM | POA: Diagnosis not present

## 2020-11-10 DIAGNOSIS — Z8041 Family history of malignant neoplasm of ovary: Secondary | ICD-10-CM | POA: Insufficient documentation

## 2020-11-10 DIAGNOSIS — Z170 Estrogen receptor positive status [ER+]: Secondary | ICD-10-CM | POA: Insufficient documentation

## 2020-11-10 DIAGNOSIS — Z803 Family history of malignant neoplasm of breast: Secondary | ICD-10-CM | POA: Insufficient documentation

## 2020-11-10 DIAGNOSIS — D0511 Intraductal carcinoma in situ of right breast: Secondary | ICD-10-CM | POA: Insufficient documentation

## 2020-11-10 DIAGNOSIS — Z808 Family history of malignant neoplasm of other organs or systems: Secondary | ICD-10-CM | POA: Diagnosis not present

## 2020-11-10 DIAGNOSIS — K219 Gastro-esophageal reflux disease without esophagitis: Secondary | ICD-10-CM | POA: Diagnosis not present

## 2020-11-10 NOTE — Progress Notes (Signed)
See MD note for nursing evaluation. °

## 2020-11-17 ENCOUNTER — Encounter: Payer: Self-pay | Admitting: *Deleted

## 2020-11-17 DIAGNOSIS — Z510 Encounter for antineoplastic radiation therapy: Secondary | ICD-10-CM | POA: Diagnosis not present

## 2020-11-17 DIAGNOSIS — D0511 Intraductal carcinoma in situ of right breast: Secondary | ICD-10-CM | POA: Diagnosis not present

## 2020-11-18 ENCOUNTER — Telehealth: Payer: Self-pay | Admitting: Hematology and Oncology

## 2020-11-18 ENCOUNTER — Ambulatory Visit
Admission: RE | Admit: 2020-11-18 | Discharge: 2020-11-18 | Disposition: A | Discharge status: Home / Self Care | Payer: Medicare PPO | Source: Ambulatory Visit | Attending: Radiation Oncology | Admitting: Radiation Oncology

## 2020-11-18 DIAGNOSIS — Z510 Encounter for antineoplastic radiation therapy: Secondary | ICD-10-CM | POA: Diagnosis not present

## 2020-11-18 DIAGNOSIS — D0511 Intraductal carcinoma in situ of right breast: Secondary | ICD-10-CM

## 2020-11-18 MED ORDER — ALRA NON-METALLIC DEODORANT (RAD-ONC)
1.0000 "application " | Freq: Once | TOPICAL | Status: AC
  Administered 2020-11-18: 1 via TOPICAL

## 2020-11-18 MED ORDER — RADIAPLEXRX EX GEL
Freq: Once | CUTANEOUS | Status: AC
Start: 2020-11-18 — End: 2020-11-18

## 2020-11-18 NOTE — Telephone Encounter (Signed)
Scheduled appt per 7/18 sch msg. Pt aware.

## 2020-11-18 NOTE — Progress Notes (Signed)

## 2020-11-19 ENCOUNTER — Ambulatory Visit
Admission: RE | Admit: 2020-11-19 | Discharge: 2020-11-19 | Disposition: A | Discharge status: Home / Self Care | Payer: Medicare PPO | Source: Ambulatory Visit | Attending: Radiation Oncology | Admitting: Radiation Oncology

## 2020-11-19 ENCOUNTER — Other Ambulatory Visit: Payer: Self-pay

## 2020-11-19 DIAGNOSIS — D0511 Intraductal carcinoma in situ of right breast: Secondary | ICD-10-CM | POA: Diagnosis not present

## 2020-11-19 DIAGNOSIS — Z510 Encounter for antineoplastic radiation therapy: Secondary | ICD-10-CM | POA: Diagnosis not present

## 2020-11-20 ENCOUNTER — Ambulatory Visit
Admission: RE | Admit: 2020-11-20 | Discharge: 2020-11-20 | Disposition: A | Discharge status: Home / Self Care | Payer: Medicare PPO | Source: Ambulatory Visit | Attending: Radiation Oncology | Admitting: Radiation Oncology

## 2020-11-20 DIAGNOSIS — D0511 Intraductal carcinoma in situ of right breast: Secondary | ICD-10-CM | POA: Diagnosis not present

## 2020-11-20 DIAGNOSIS — Z510 Encounter for antineoplastic radiation therapy: Secondary | ICD-10-CM | POA: Diagnosis not present

## 2020-11-21 ENCOUNTER — Other Ambulatory Visit: Payer: Self-pay

## 2020-11-21 ENCOUNTER — Ambulatory Visit
Admission: RE | Admit: 2020-11-21 | Discharge: 2020-11-21 | Disposition: A | Discharge status: Home / Self Care | Payer: Medicare PPO | Source: Ambulatory Visit | Attending: Radiation Oncology | Admitting: Radiation Oncology

## 2020-11-21 DIAGNOSIS — Z510 Encounter for antineoplastic radiation therapy: Secondary | ICD-10-CM | POA: Diagnosis not present

## 2020-11-21 DIAGNOSIS — D0511 Intraductal carcinoma in situ of right breast: Secondary | ICD-10-CM | POA: Diagnosis not present

## 2020-11-24 ENCOUNTER — Other Ambulatory Visit: Payer: Self-pay

## 2020-11-24 ENCOUNTER — Ambulatory Visit
Admission: RE | Admit: 2020-11-24 | Discharge: 2020-11-24 | Disposition: A | Discharge status: Home / Self Care | Payer: Medicare PPO | Source: Ambulatory Visit | Attending: Radiation Oncology | Admitting: Radiation Oncology

## 2020-11-24 DIAGNOSIS — D0511 Intraductal carcinoma in situ of right breast: Secondary | ICD-10-CM | POA: Diagnosis not present

## 2020-11-24 DIAGNOSIS — Z510 Encounter for antineoplastic radiation therapy: Secondary | ICD-10-CM | POA: Diagnosis not present

## 2020-11-25 ENCOUNTER — Ambulatory Visit
Admission: RE | Admit: 2020-11-25 | Discharge: 2020-11-25 | Disposition: A | Discharge status: Home / Self Care | Payer: Medicare PPO | Source: Ambulatory Visit | Attending: Radiation Oncology | Admitting: Radiation Oncology

## 2020-11-25 DIAGNOSIS — Z510 Encounter for antineoplastic radiation therapy: Secondary | ICD-10-CM | POA: Diagnosis not present

## 2020-11-25 DIAGNOSIS — D0511 Intraductal carcinoma in situ of right breast: Secondary | ICD-10-CM | POA: Diagnosis not present

## 2020-11-26 ENCOUNTER — Ambulatory Visit: Admission: RE | Admit: 2020-11-26 | Payer: Medicare PPO | Source: Ambulatory Visit

## 2020-11-26 ENCOUNTER — Other Ambulatory Visit: Payer: Self-pay

## 2020-11-27 ENCOUNTER — Ambulatory Visit
Admission: RE | Admit: 2020-11-27 | Discharge: 2020-11-27 | Disposition: A | Discharge status: Home / Self Care | Payer: Medicare PPO | Source: Ambulatory Visit | Attending: Radiation Oncology | Admitting: Radiation Oncology

## 2020-11-27 DIAGNOSIS — D0511 Intraductal carcinoma in situ of right breast: Secondary | ICD-10-CM | POA: Diagnosis not present

## 2020-11-27 DIAGNOSIS — Z510 Encounter for antineoplastic radiation therapy: Secondary | ICD-10-CM | POA: Diagnosis not present

## 2020-11-28 ENCOUNTER — Other Ambulatory Visit: Payer: Self-pay

## 2020-11-28 ENCOUNTER — Ambulatory Visit: Admission: RE | Admit: 2020-11-28 | Payer: Medicare PPO | Source: Ambulatory Visit

## 2020-11-28 DIAGNOSIS — D0511 Intraductal carcinoma in situ of right breast: Secondary | ICD-10-CM | POA: Diagnosis not present

## 2020-11-28 DIAGNOSIS — Z510 Encounter for antineoplastic radiation therapy: Secondary | ICD-10-CM | POA: Diagnosis not present

## 2020-12-01 ENCOUNTER — Other Ambulatory Visit: Payer: Self-pay

## 2020-12-01 ENCOUNTER — Ambulatory Visit
Admission: RE | Admit: 2020-12-01 | Discharge: 2020-12-01 | Disposition: A | Discharge status: Home / Self Care | Payer: Medicare PPO | Source: Ambulatory Visit | Attending: Radiation Oncology | Admitting: Radiation Oncology

## 2020-12-01 DIAGNOSIS — D0511 Intraductal carcinoma in situ of right breast: Secondary | ICD-10-CM | POA: Diagnosis not present

## 2020-12-01 DIAGNOSIS — Z170 Estrogen receptor positive status [ER+]: Secondary | ICD-10-CM | POA: Diagnosis not present

## 2020-12-01 DIAGNOSIS — Z510 Encounter for antineoplastic radiation therapy: Secondary | ICD-10-CM | POA: Diagnosis not present

## 2020-12-01 MED ORDER — RADIAPLEXRX EX GEL: Freq: Once | CUTANEOUS | Status: AC

## 2020-12-02 ENCOUNTER — Ambulatory Visit
Admission: RE | Admit: 2020-12-02 | Discharge: 2020-12-02 | Disposition: A | Discharge status: Home / Self Care | Payer: Medicare PPO | Source: Ambulatory Visit | Attending: Radiation Oncology | Admitting: Radiation Oncology

## 2020-12-02 DIAGNOSIS — Z170 Estrogen receptor positive status [ER+]: Secondary | ICD-10-CM | POA: Diagnosis not present

## 2020-12-02 DIAGNOSIS — Z510 Encounter for antineoplastic radiation therapy: Secondary | ICD-10-CM | POA: Diagnosis not present

## 2020-12-02 DIAGNOSIS — D0511 Intraductal carcinoma in situ of right breast: Secondary | ICD-10-CM | POA: Diagnosis not present

## 2020-12-03 ENCOUNTER — Ambulatory Visit
Admission: RE | Admit: 2020-12-03 | Discharge: 2020-12-03 | Disposition: A | Discharge status: Home / Self Care | Payer: Medicare PPO | Source: Ambulatory Visit | Attending: Radiation Oncology | Admitting: Radiation Oncology

## 2020-12-03 ENCOUNTER — Other Ambulatory Visit: Payer: Self-pay

## 2020-12-03 DIAGNOSIS — Z170 Estrogen receptor positive status [ER+]: Secondary | ICD-10-CM | POA: Diagnosis not present

## 2020-12-03 DIAGNOSIS — D0511 Intraductal carcinoma in situ of right breast: Secondary | ICD-10-CM | POA: Diagnosis not present

## 2020-12-03 DIAGNOSIS — Z510 Encounter for antineoplastic radiation therapy: Secondary | ICD-10-CM | POA: Diagnosis not present

## 2020-12-04 ENCOUNTER — Ambulatory Visit
Admission: RE | Admit: 2020-12-04 | Discharge: 2020-12-04 | Disposition: A | Discharge status: Home / Self Care | Payer: Medicare PPO | Source: Ambulatory Visit | Attending: Radiation Oncology | Admitting: Radiation Oncology

## 2020-12-04 DIAGNOSIS — Z170 Estrogen receptor positive status [ER+]: Secondary | ICD-10-CM | POA: Diagnosis not present

## 2020-12-04 DIAGNOSIS — D0511 Intraductal carcinoma in situ of right breast: Secondary | ICD-10-CM | POA: Diagnosis not present

## 2020-12-04 DIAGNOSIS — Z510 Encounter for antineoplastic radiation therapy: Secondary | ICD-10-CM | POA: Diagnosis not present

## 2020-12-05 ENCOUNTER — Ambulatory Visit
Admission: RE | Admit: 2020-12-05 | Discharge: 2020-12-05 | Disposition: A | Discharge status: Home / Self Care | Payer: Medicare PPO | Source: Ambulatory Visit | Attending: Radiation Oncology | Admitting: Radiation Oncology

## 2020-12-05 DIAGNOSIS — D0511 Intraductal carcinoma in situ of right breast: Secondary | ICD-10-CM | POA: Diagnosis not present

## 2020-12-05 DIAGNOSIS — Z170 Estrogen receptor positive status [ER+]: Secondary | ICD-10-CM | POA: Diagnosis not present

## 2020-12-05 DIAGNOSIS — Z510 Encounter for antineoplastic radiation therapy: Secondary | ICD-10-CM | POA: Diagnosis not present

## 2020-12-08 ENCOUNTER — Ambulatory Visit
Admission: RE | Admit: 2020-12-08 | Discharge: 2020-12-08 | Disposition: A | Discharge status: Home / Self Care | Payer: Medicare PPO | Source: Ambulatory Visit | Attending: Radiation Oncology | Admitting: Radiation Oncology

## 2020-12-08 ENCOUNTER — Encounter: Payer: Self-pay | Admitting: *Deleted

## 2020-12-08 ENCOUNTER — Other Ambulatory Visit: Payer: Self-pay

## 2020-12-08 DIAGNOSIS — D0511 Intraductal carcinoma in situ of right breast: Secondary | ICD-10-CM | POA: Diagnosis not present

## 2020-12-08 DIAGNOSIS — Z170 Estrogen receptor positive status [ER+]: Secondary | ICD-10-CM | POA: Diagnosis not present

## 2020-12-08 DIAGNOSIS — Z510 Encounter for antineoplastic radiation therapy: Secondary | ICD-10-CM | POA: Diagnosis not present

## 2020-12-09 ENCOUNTER — Ambulatory Visit
Admission: RE | Admit: 2020-12-09 | Discharge: 2020-12-09 | Disposition: A | Discharge status: Home / Self Care | Payer: Medicare PPO | Source: Ambulatory Visit | Attending: Radiation Oncology | Admitting: Radiation Oncology

## 2020-12-09 DIAGNOSIS — D0511 Intraductal carcinoma in situ of right breast: Secondary | ICD-10-CM | POA: Diagnosis not present

## 2020-12-09 DIAGNOSIS — Z510 Encounter for antineoplastic radiation therapy: Secondary | ICD-10-CM | POA: Diagnosis not present

## 2020-12-09 DIAGNOSIS — Z170 Estrogen receptor positive status [ER+]: Secondary | ICD-10-CM | POA: Diagnosis not present

## 2020-12-10 ENCOUNTER — Other Ambulatory Visit: Payer: Self-pay

## 2020-12-10 ENCOUNTER — Encounter: Payer: Self-pay | Admitting: Radiation Oncology

## 2020-12-10 ENCOUNTER — Ambulatory Visit
Admission: RE | Admit: 2020-12-10 | Discharge: 2020-12-10 | Disposition: A | Discharge status: Home / Self Care | Payer: Medicare PPO | Source: Ambulatory Visit | Attending: Radiation Oncology | Admitting: Radiation Oncology

## 2020-12-10 DIAGNOSIS — D0511 Intraductal carcinoma in situ of right breast: Secondary | ICD-10-CM | POA: Diagnosis not present

## 2020-12-10 DIAGNOSIS — Z170 Estrogen receptor positive status [ER+]: Secondary | ICD-10-CM | POA: Diagnosis not present

## 2020-12-10 DIAGNOSIS — Z510 Encounter for antineoplastic radiation therapy: Secondary | ICD-10-CM | POA: Diagnosis not present

## 2020-12-10 NOTE — Progress Notes (Signed)
Patient Care Team: Donald Prose, MD as PCP - General (Family Medicine) Mauro Kaufmann, RN as Oncology Nurse Navigator Rockwell Germany, RN as Oncology Nurse Navigator Rolm Bookbinder, MD as Consulting Physician (General Surgery) Nicholas Lose, MD as Consulting Physician (Hematology and Oncology) Gery Pray, MD as Consulting Physician (Radiation Oncology)  DIAGNOSIS:    ICD-10-CM   1. Ductal carcinoma in situ (DCIS) of right breast  D05.11       SUMMARY OF ONCOLOGIC HISTORY: 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%   09/10/2020 Cancer Staging   Staging form: Breast, AJCC 8th Edition - Clinical stage from 09/10/2020: Stage 0 (cTis (DCIS), cN0, cM0, ER+, PR+, HER2: Not Assessed) - Signed by Nicholas Lose, MD on 09/10/2020 Stage prefix: Initial diagnosis Nuclear grade: G3 Laterality: Right Staged by: Pathologist and managing physician Stage used in treatment planning: Yes National guidelines used in treatment planning: Yes Type of national guideline used in treatment planning: NCCN   09/16/2020 Genetic Testing   Negative hereditary cancer genetic testing: no pathogenic variants detected in [place] Panel or [facility] CancerNext-Expanded +RNAinsight Panel.  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 The report dates are Sep 16, 2020 and Sep 30, 2020.    The BRCAplus panel offered by [employer] and includes sequencing and deletion/duplication analysis for the following 8 genes: ATM, BRCA1, BRCA2, CDH1, CHEK2, PALB2, PTEN, and TP53.  The CancerNext-Expanded gene panel offered by [facility] and includes sequencing, rearrangement, and RNA analysis for the following 77 genes: AIP, ALK, APC, ATM, AXIN2, BAP1, BARD1, BLM, BMPR1A, BRCA1,  BRCA2, BRIP1, CDC73, CDH1, CDK4, CDKN1B, CDKN2A, CHEK2, CTNNA1, DICER1, FANCC, FH, FLCN, GALNT12, KIF1B, LZTR1, MAX, MEN1, MET, MLH1, MSH2, MSH3, MSH6, MUTYH, NBN, NF1, NF2, NTHL1, PALB2, PHOX2B, PMS2, POT1, PRKAR1A, PTCH1, PTEN, RAD51C, RAD51D, RB1, RECQL, RET, SDHA, SDHAF2, SDHB, SDHC, SDHD, SMAD4, SMARCA4, SMARCB1, SMARCE1, STK11, SUFU, TMEM127, TP53, TSC1, TSC2, VHL and XRCC2 (sequencing and deletion/duplication); EGFR, EGLN1, HOXB13, KIT, MITF, PDGFRA, POLD1, and POLE (sequencing only); EPCAM and GREM1 (deletion/duplication only).    10/08/2020 Surgery   Right lumpectomy [name]): high grade DCIS with necrosis, clear margins.    11/19/2020 - 12/10/2020 Radiation Therapy        CHIEF COMPLIANT: Follow-up of right breast cancer  INTERVAL HISTORY: Monica Perry is a 65 y.o. with above-mentioned history of DCIS of the right breast. She underwent a right lumpectomy on 10/08/20 with Dr. [name] which showed high grade DCIS with necrosis and clear margins. She complete radiation therapy on 12/10/20. She presents to the clinic today for follow-up.   ALLERGIES:  is allergic to aspirin, clarithromycin, fluticasone-salmeterol, ibuprofen, red dye, septra [sulfamethoxazole-trimethoprim], stevia glycerite extract [flavoring agent], sulfa antibiotics, and sulfonamide derivatives.  MEDICATIONS:  Current Outpatient Medications  Medication Sig Dispense Refill   tamoxifen (NOLVADEX) 20 MG tablet Take 1 tablet (20 mg total) by mouth daily. 90 tablet 3   Biotin 5 MG CAPS Take by mouth.     calcium carbonate (TUMS - DOSED IN MG ELEMENTAL CALCIUM) 500 MG chewable tablet Chew 1 tablet by mouth daily.     cholecalciferol (VITAMIN D3) 25 MCG (1000 UNIT) tablet Take 1,000 Units by mouth daily.     fish oil-omega-3 fatty acids 1000 MG capsule Take 2 g by mouth daily.  Multiple Vitamins-Minerals (MULTIVITAMIN WITH MINERALS) tablet Take 1 tablet by mouth daily.     pravastatin (PRAVACHOL) 20 MG tablet Take 1  tablet by mouth daily.     No current facility-administered medications for this visit.    PHYSICAL EXAMINATION: ECOG PERFORMANCE STATUS: 1 - Symptomatic but completely ambulatory  Vitals:   12/11/20 1405  BP: (!) 145/74  Pulse: (!) 104  Resp: 18  Temp: (!) 97.5 F (36.4 C)  SpO2: 98%   Filed Weights   12/11/20 1405  Weight: 190 lb 3.2 oz (86.3 kg)    LABORATORY DATA:  I have reviewed the data as listed CMP Latest Ref Rng & Units 09/10/2020 01/03/2015  Glucose 70 - 99 mg/dL 102(H) 108(H)  BUN 8 - 23 mg/dL 12 17  Creatinine 0.44 - 1.00 mg/dL 0.73 0.70  Sodium 135 - 145 mmol/L 142 135  Potassium 3.5 - 5.1 mmol/L 3.9 4.3  Chloride 98 - 111 mmol/L 105 103  CO2 22 - 32 mmol/L 26 -  Calcium 8.9 - 10.3 mg/dL 10.4(H) -  Total Protein 6.5 - 8.1 g/dL 7.2 -  Total Bilirubin 0.3 - 1.2 mg/dL 0.6 -  Alkaline Phos 38 - 126 U/L 79 -  AST 15 - 41 U/L 19 -  ALT 0 - 44 U/L 16 -    Lab Results  Component Value Date   WBC 7.2 09/10/2020   HGB 14.3 09/10/2020   HCT 42.8 09/10/2020   MCV 88.2 09/10/2020   PLT 260 09/10/2020   NEUTROABS 5.5 09/10/2020    ASSESSMENT & PLAN:  Ductal carcinoma in situ (DCIS) of right breast 09/05/2020:Screening detected left breast calcifications 0.4 cm span, stereotactic biopsy revealed high-grade DCIS with calcifications and necrosis ER 80%, PR 5%  Recommendation: 1.  10/08/2020:Right lumpectomy [name]): high grade DCIS with necrosis, clear margins.  2. Followed by adjuvant radiation therapy completed 12/10/2020 3. Followed by antiestrogen therapy with tamoxifen 5 years to start 01/01/2021   We discussed the risks and benefits of tamoxifen. These include but not limited to insomnia, hot flashes, mood changes, vaginal dryness, and weight gain. Although rare, serious side effects including endometrial cancer, risk of blood clots were also discussed. We strongly believe that the benefits far outweigh the risks. Patient understands these risks and consented  to starting treatment. Planned treatment duration is 5 years.  12-month follow-up with survivorship care plan visit.  After that I will see her in 6 months and after that annually.   No orders of the defined types were placed in this encounter.  The patient has a good understanding of the overall plan. she agrees with it. she will call with any problems that may develop before the next visit here.  Total time spent: 30 mins including face to face time and time spent for planning, charting and coordination of care  Rulon Eisenmenger, MD, MPH 12/11/2020  I, Thana Ates, am acting as scribe for Dr. Nicholas Lose.  I have reviewed the above documentation for accuracy and completeness, and I agree with the above.

## 2020-12-11 ENCOUNTER — Inpatient Hospital Stay: Payer: Medicare PPO | Attending: Hematology and Oncology | Admitting: Hematology and Oncology

## 2020-12-11 DIAGNOSIS — Z170 Estrogen receptor positive status [ER+]: Secondary | ICD-10-CM | POA: Insufficient documentation

## 2020-12-11 DIAGNOSIS — Z79810 Long term (current) use of selective estrogen receptor modulators (SERMs): Secondary | ICD-10-CM | POA: Insufficient documentation

## 2020-12-11 DIAGNOSIS — D0511 Intraductal carcinoma in situ of right breast: Secondary | ICD-10-CM | POA: Diagnosis not present

## 2020-12-11 DIAGNOSIS — Z923 Personal history of irradiation: Secondary | ICD-10-CM | POA: Diagnosis not present

## 2020-12-11 MED ORDER — TAMOXIFEN CITRATE 20 MG PO TABS: 20.0000 mg | ORAL_TABLET | Freq: Every day | ORAL | 3 refills | Status: DC

## 2020-12-11 NOTE — Assessment & Plan Note (Signed)
09/05/2020:Screening detected left breast calcifications 0.4 cm span, stereotactic biopsy revealed high-grade DCIS with calcifications and necrosis ER 80%, PR 5%  Recommendation: 1.  10/08/2020:Right lumpectomy Monica Perry): high grade DCIS with necrosis, clear margins.  2. Followed by adjuvant radiation therapy completed 12/10/2020 3. Followed by antiestrogen therapy with tamoxifen 5 years  We discussed the risks and benefits of tamoxifen. These include but not limited to insomnia, hot flashes, mood changes, vaginal dryness, and weight gain. Although rare, serious side effects including endometrial cancer, risk of blood clots were also discussed. We strongly believe that the benefits far outweigh the risks. Patient understands these risks and consented to starting treatment. Planned treatment duration is 5 years.

## 2021-01-10 NOTE — Progress Notes (Incomplete)
  Radiation Oncology         [phone]) [phone] ________________________________  Patient Name: Monica Perry MRN: [id number] DOB: 06/14/1955 Referring Physician: Jenna Luo (Profile Not Attached) Date of Service: 12/10/2020 [facility] [place]  End Of Treatment Note  Diagnoses: D05.11-Intraductal carcinoma in situ of right breast  Cancer Staging: Stage 0 Right Breast UOQ, DCIS, ER + / PR + /, Grade 3  Intent: Curative  Radiation Treatment Dates: 11/18/2020 through 12/10/2020 Site Technique Total Dose (Gy) Dose per Fx (Gy) Completed Fx Beam Energies  Breast, Right: Breast_Rt 3D 42.72/42.72 2.67 16/16 6X, 10X   Narrative: The patient tolerated radiation therapy relatively well. She states that she is doing very well and has good energy and denies pain, swelling, tenderness, and has a good range of motion. Skin is intact and patient using her radiaplex as directed. Right breast showed some erythema on physical exam.    Plan: The patient will follow-up with radiation oncology in one month .  ________________________________________________ -----------------------------------  Blair Promise, PhD, MD  This document serves as a record of services personally performed by Gery Pray, MD. It was created on his behalf by Roney Mans, a trained medical scribe. The creation of this record is based on the scribe's personal observations and the provider's statements to them. This document has been checked and approved by the attending provider.

## 2021-01-11 NOTE — Progress Notes (Signed)
Radiation Oncology         [phone]) [phone] ________________________________  Name: Monica Perry MRN: [id number]  Date: 01/12/2021  DOB: 1955/11/26  Follow-Up Visit Note  CC: Donald Prose, MD  Susy Frizzle, MD    ICD-10-CM   1. Ductal carcinoma in situ (DCIS) of right breast  D05.11       Diagnosis:Stage 0 Right Breast UOQ, DCIS, ER + / PR + /, Grade 3  Interval Since Last Radiation: 1 month and 2 days   Intent: Curative  Radiation Treatment Dates: 11/18/2020 through 12/10/2020 Site Technique Total Dose (Gy) Dose per Fx (Gy) Completed Fx Beam Energies  Breast, Right: Breast_Rt 3D 42.72/42.72 2.67 16/16 6X, 10X   Narrative:  The patient returns today for routine follow-up. She tolerated her recent radiation treatment well with the exception of some mild erythema to her right breast near the end of her treatment. She otherwise reported having a good energy level and denied any pain.   The patient most recently followed up with Dr. Lindi Adie on 12/11/20. During this visit, the patient opted to proceed with antiestrogen therapy with tamoxifen 5 years, starting on 01/01/2021.     She denies any residual tenderness within the right breast or pain.  She denies any nipple discharge or bleeding.  She has started tamoxifen and is tolerating this well thus far.  She does have hot flashes but has not noticed any changes from her baseline .                        Allergies:  is allergic to aspirin, clarithromycin, fluticasone-salmeterol, ibuprofen, red dye, septra [sulfamethoxazole-trimethoprim], stevia glycerite extract [flavoring agent], sulfa antibiotics, and sulfonamide derivatives.  Meds: Current Outpatient Medications  Medication Sig Dispense Refill   Biotin 5 MG CAPS Take by mouth.     calcium carbonate (TUMS - DOSED IN MG ELEMENTAL CALCIUM) 500 MG chewable tablet Chew 1 tablet by mouth daily.     cholecalciferol (VITAMIN D3) 25 MCG (1000 UNIT) tablet Take 1,000 Units by mouth daily.      fish oil-omega-3 fatty acids 1000 MG capsule Take 2 g by mouth daily.     Multiple Vitamins-Minerals (MULTIVITAMIN WITH MINERALS) tablet Take 1 tablet by mouth daily.     pravastatin (PRAVACHOL) 20 MG tablet Take 1 tablet by mouth daily.     tamoxifen (NOLVADEX) 20 MG tablet Take 1 tablet (20 mg total) by mouth daily. 90 tablet 3   No current facility-administered medications for this encounter.    Physical Findings: The patient is in no acute distress. Patient is alert and oriented.  height is '5\' 4"'$  (1.626 m) and weight is 188 lb (85.3 kg). Her temperature is 97.9 F (36.6 C). Her blood pressure is 150/84 (abnormal) and her pulse is 76. Her respiration is 20 and oxygen saturation is 99%. .  No significant changes. Lungs are clear to auscultation bilaterally. Heart has regular rate and rhythm. No palpable cervical, supraclavicular, or axillary adenopathy. Abdomen soft, non-tender, normal bowel sounds.  Left breast: no palpable mass, nipple discharge or bleeding.  The right breast area shows minimal hyperpigmentation changes in the breast.  No dominant mass appreciated breast nipple discharge or bleeding.  No significant swelling noted.    Lab Findings: Lab Results  Component Value Date   WBC 7.2 09/10/2020   HGB 14.3 09/10/2020   HCT 42.8 09/10/2020   MCV 88.2 09/10/2020   PLT 260 09/10/2020    Radiographic  Findings: No results found.  Impression:  Stage 0 Right Breast UOQ, DCIS, ER + / PR + /, Grade 3  Patient tolerated her radiation therapy well.  She does not report any residual side effects from her treatment.  Plan: As needed follow-up in radiation oncology.  She will continue close follow-up with medical oncology and continue on adjuvant hormonal therapy.    ____________________________________  Blair Promise, PhD, MD   This document serves as a record of services personally performed by Gery Pray, MD. It was created on his behalf by Roney Mans, a trained  medical scribe. The creation of this record is based on the scribe's personal observations and the provider's statements to them. This document has been checked and approved by the attending provider.

## 2021-01-12 ENCOUNTER — Encounter: Payer: Self-pay | Admitting: Radiation Oncology

## 2021-01-12 ENCOUNTER — Other Ambulatory Visit: Payer: Self-pay

## 2021-01-12 ENCOUNTER — Ambulatory Visit
Admission: RE | Admit: 2021-01-12 | Discharge: 2021-01-12 | Disposition: A | Discharge status: Home / Self Care | Payer: Medicare PPO | Source: Ambulatory Visit | Attending: Radiation Oncology | Admitting: Radiation Oncology

## 2021-01-12 VITALS — BP 150/84 | HR 76 | Temp 97.9°F | Resp 20 | Ht 64.0 in | Wt 188.0 lb

## 2021-01-12 DIAGNOSIS — D0511 Intraductal carcinoma in situ of right breast: Secondary | ICD-10-CM | POA: Diagnosis not present

## 2021-01-12 NOTE — Progress Notes (Signed)
Monica Perry is here today for follow up post radiation to the breast.   Breast Side:Right   They completed their radiation on: 12/10/20  Does the patient complain of any of the following: Post radiation skin issues:  Patient reports improvement to breast.  Breast Tenderness: no Breast Swelling: no Lymphadema: no Range of Motion limitations: Patient has full range of motion to right arm.  Fatigue post radiation: Reports having a good energy level.  Appetite good/fair/poor: Good  Additional comments if applicable:  Vitals:   [id number]  BP: (!) 150/84  Pulse: 76  Resp: 20  Temp: 97.9 F (36.6 C)  SpO2: 99%  Weight: 188 lb (85.3 kg)  Height: '5\' 4"'$  (1.626 m)

## 2021-03-15 NOTE — Progress Notes (Signed)
SURVIVORSHIP VISIT:   BRIEF ONCOLOGIC HISTORY:  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%   09/10/2020 Cancer Staging   Staging form: Breast, AJCC 8th Edition - Clinical stage from 09/10/2020: Stage 0 (cTis (DCIS), cN0, cM0, ER+, PR+, HER2: Not Assessed) - Signed by Monica Lose, MD on 09/10/2020 Stage prefix: Initial diagnosis Nuclear grade: G3 Laterality: Right Staged by: Pathologist and managing physician Stage used in treatment planning: Yes National guidelines used in treatment planning: Yes Type of national guideline used in treatment planning: NCCN    09/16/2020 Genetic Testing   Negative hereditary cancer genetic testing: no pathogenic variants detected in [place] Panel or [facility] CancerNext-Expanded +RNAinsight Panel.  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 The report dates are Sep 16, 2020 and Sep 30, 2020.    The BRCAplus panel offered by [employer] and includes sequencing and deletion/duplication analysis for the following 8 genes: ATM, BRCA1, BRCA2, CDH1, CHEK2, PALB2, PTEN, and TP53.  The CancerNext-Expanded gene panel offered by [facility] and includes sequencing, rearrangement, and RNA analysis for the following 77 genes: AIP, ALK, APC, ATM, AXIN2, BAP1, BARD1, BLM, BMPR1A, BRCA1, BRCA2, BRIP1, CDC73, CDH1, CDK4, CDKN1B, CDKN2A, CHEK2, CTNNA1, DICER1, FANCC, FH, FLCN, GALNT12, KIF1B, LZTR1, MAX, MEN1, MET, MLH1, MSH2, MSH3, MSH6, MUTYH, NBN, NF1, NF2, NTHL1, PALB2, PHOX2B, PMS2, POT1, PRKAR1A, PTCH1, PTEN, RAD51C, RAD51D, RB1, RECQL, RET, SDHA, SDHAF2, SDHB, SDHC, SDHD, SMAD4, SMARCA4, SMARCB1, SMARCE1, STK11, SUFU, TMEM127, TP53, TSC1, TSC2, VHL and XRCC2 (sequencing and deletion/duplication); EGFR, EGLN1, HOXB13, KIT, MITF,  PDGFRA, POLD1, and POLE (sequencing only); EPCAM and GREM1 (deletion/duplication only).    10/08/2020 Surgery   Right lumpectomy Monica Perry): high grade DCIS with necrosis, clear margins.    10/08/2020 Cancer Staging   Staging form: Breast, AJCC 8th Edition - Pathologic stage from 10/08/2020: Stage 0 (pTis (DCIS), pN0, cM0) - Signed by Monica Phlegm, NP on 03/11/2021 Stage prefix: Initial diagnosis    11/18/2020 - 12/10/2020 Radiation Therapy   11/18/2020 through 12/10/2020 Site Technique Total Dose (Gy) Dose per Fx (Gy) Completed Fx Beam Energies  Breast, Right: Breast_Rt 3D 42.72/42.72 2.67 16/16 6X, 10X      01/2021 -  Anti-estrogen oral therapy   Tamoxifen daily x 5 years     INTERVAL HISTORY:  [name] to review her survivorship care plan detailing her treatment course for breast cancer, as well as monitoring long-term side effects of that treatment, education regarding health maintenance, screening, and overall wellness and health promotion.     Overall, [name] reports feeling quite well.  She is taking Tamoxifen daily and is tolerating it well.  She has no hot flashes, vaginal wetness, or arthralgias.  She is exercising regularly.  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    REVIEW OF SYSTEMS:  Review of Systems  Constitutional:  Negative for appetite change, chills, fatigue, fever and unexpected weight change.  HENT:   Negative for hearing loss, lump/mass and trouble swallowing.   Eyes:  Negative for eye problems and icterus.  Respiratory:  Negative for chest tightness, cough and shortness of breath.   Cardiovascular:  Negative for chest pain, leg swelling and palpitations.  Gastrointestinal:  Negative for abdominal distention, abdominal pain, constipation, diarrhea, nausea and vomiting.  Endocrine:  Negative for hot flashes.  Genitourinary:  Negative for difficulty urinating.   Musculoskeletal:  Negative for arthralgias.  Skin:   Negative for itching and rash.  Neurological:  Negative for dizziness, extremity weakness, headaches and numbness.  Hematological:  Negative for adenopathy. Does not bruise/bleed easily.  Psychiatric/Behavioral:  Negative for depression. The patient is not nervous/anxious.   Breast: Denies any new nodularity, masses, tenderness, nipple changes, or nipple discharge.      ONCOLOGY TREATMENT TEAM:  1. Surgeon:  Dr. Donne Perry at [facility] 2. Medical Oncologist: Dr. Lindi Perry  3. Radiation Oncologist: Dr. Sondra Perry    PAST MEDICAL/SURGICAL HISTORY:  Past Medical History:  Diagnosis Date   Allergy    Arthritis    knees   Breast cancer ([facility]) 08/2020   right breast DCIS   Elevated cholesterol    Endometrial carcinoma ([facility])    Family history of breast cancer 09/10/2020   Family history of melanoma 09/10/2020   Family history of ovarian cancer 09/10/2020   GERD (gastroesophageal reflux disease)    History of endometrial cancer 09/10/2020   Past Surgical History:  Procedure Laterality Date   ABDOMINAL HYSTERECTOMY     BREAST LUMPECTOMY WITH RADIOACTIVE SEED LOCALIZATION Right 10/08/2020   Procedure: RIGHT BREAST LUMPECTOMY WITH RADIOACTIVE SEED LOCALIZATION;  Surgeon: Monica Bookbinder, MD;  Location: [place];  Service: General;  Laterality: Right;   CESAREAN SECTION       ALLERGIES:  Allergies  Allergen Reactions   Aspirin Swelling    Just only the orange coating aspirin   Clarithromycin    Fluticasone-Salmeterol Swelling    Lips swell Lips swell    Ibuprofen Swelling    Lips swell Lips swell    Red Dye    Septra [Sulfamethoxazole-Trimethoprim]    Stevia Glycerite Extract [Flavoring Agent]    Sulfa Antibiotics Itching   Sulfonamide Derivatives      CURRENT MEDICATIONS:  Outpatient Encounter Medications as of 03/16/2021  Medication Sig   Biotin 5 MG CAPS Take by mouth.   calcium carbonate (TUMS - DOSED IN MG ELEMENTAL CALCIUM) 500 MG  chewable tablet Chew 1 tablet by mouth daily.   cholecalciferol (VITAMIN D3) 25 MCG (1000 UNIT) tablet Take 1,000 Units by mouth daily.   fish oil-omega-3 fatty acids 1000 MG capsule Take 2 g by mouth daily.   Multiple Vitamins-Minerals (MULTIVITAMIN WITH MINERALS) tablet Take 1 tablet by mouth daily.   pravastatin (PRAVACHOL) 20 MG tablet Take 1 tablet by mouth daily.   tamoxifen (NOLVADEX) 20 MG tablet Take 1 tablet (20 mg total) by mouth daily.   No facility-administered encounter medications on file as of 03/16/2021.     ONCOLOGIC FAMILY HISTORY:  Family History  Problem Relation Age of Onset   Lymphoma Brother        dx late 57s   Melanoma Brother        dx after 45   Ovarian cancer Maternal Aunt        dx 42s   Breast cancer Paternal Aunt        dx 68s   Uterine cancer Maternal Aunt        dx 30s     GENETIC COUNSELING/TESTING: See above  SOCIAL HISTORY:  Social History   Socioeconomic History   Marital status: Married    Spouse name: Not on file   Number of children: Not on file   Years of education: Not on file   Highest education level: Not on file  Occupational  History   Not on file  Tobacco Use   Smoking status: Never   Smokeless tobacco: Never  Substance and Sexual Activity   Alcohol use: No   Drug use: No   Sexual activity: Yes    Birth control/protection: Surgical  Other Topics Concern   Not on file  Social History Narrative   Not on file   Social Determinants of Health   Financial Resource Strain: Low Risk    Difficulty of Paying Living Expenses: Not hard at all  Food Insecurity: No Food Insecurity   Worried About Charity fundraiser in the Last Year: Never true   Ran Out of Food in the Last Year: Never true  Transportation Needs: No Transportation Needs   Lack of Transportation (Medical): No   Lack of Transportation (Non-Medical): No  Physical Activity: Sufficiently Active   Days of Exercise per Week: 7 days   Minutes of Exercise per  Session: 30 min  Stress: No Stress Concern Present   Feeling of Stress : Only a little  Social Connections: Engineer, building services of Communication with Friends and Family: More than three times a week   Frequency of Social Gatherings with Friends and Family: More than three times a week   Attends Religious Services: More than 4 times per year   Active Member of [employer] or Organizations: Yes   Attends Music therapist: More than 4 times per year   Marital Status: Married  Human resources officer Violence: Not At Risk   Fear of Current or Ex-Partner: No   Emotionally Abused: No   Physically Abused: No   Sexually Abused: No     OBSERVATIONS/OBJECTIVE:  BP (!) 157/82 (BP Location: Left Arm, Patient Position: Sitting)   Pulse 93   Temp (!) 97.4 F (36.3 C) (Tympanic)   Resp 18   Ht $R'5\' 4"'Qt$  (1.626 m)   Wt 186 lb 14.4 oz (84.8 kg)   SpO2 97%   BMI 32.08 kg/m  GENERAL: Patient is a well appearing female in no acute distress HEENT:  Sclerae anicteric.  Oropharynx clear and moist. No ulcerations or evidence of oropharyngeal candidiasis. Neck is supple.  NODES:  No cervical, supraclavicular, or axillary lymphadenopathy palpated.  BREAST EXAM:  right breast s/p lumpectomy and radiation, no sign of cancer recurrence, left breast benign LUNGS:  Clear to auscultation bilaterally.  No wheezes or rhonchi. HEART:  Regular rate and rhythm. No murmur appreciated. ABDOMEN:  Soft, nontender.  Positive, normoactive bowel sounds. No organomegaly palpated. MSK:  No focal spinal tenderness to palpation. Full range of motion bilaterally in the upper extremities. EXTREMITIES:  No peripheral edema.   SKIN:  Clear with no obvious rashes or skin changes. No nail dyscrasia. NEURO:  Nonfocal. Well oriented.  Appropriate affect.   LABORATORY DATA:  None for this visit.  DIAGNOSTIC IMAGING:  None for this visit.      ASSESSMENT AND PLAN:  Ms.. Horace is a pleasant 65 y.o. female with  Stage 0 right breast DCIS, ER+/PR+, diagnosed in 08/2020, treated with lumpectomy, adjuvant radiation therapy, and anti-estrogen therapy with Tamoxifen beginning in 01/2021.  She presents to the Survivorship Clinic for our initial meeting and routine follow-up post-completion of treatment for breast cancer.    1. Stage 0 right breast cancer:  Ms. Platt is continuing to recover from definitive treatment for breast cancer. She will follow-up with her medical oncologist, Dr. Lindi Perry in 6 months with history and physical exam per surveillance protocol.  She  will continue her anti-estrogen therapy with Tamoxifen. Thus far, she is tolerating the Tamoxifen well, with minimal side effects. She was instructed to make Dr. Lindi Perry or myself aware if she begins to experience any worsening side effects of the medication and I could see her back in clinic to help manage those side effects, as needed. Her mammogram is due /2023; orders placed today.   Today, a comprehensive survivorship care plan and treatment summary was reviewed with the patient today detailing her breast cancer diagnosis, treatment course, potential late/long-term effects of treatment, appropriate follow-up care with recommendations for the future, and patient education resources.  A copy of this summary, along with a letter will be sent to the patient's primary care provider via mail/fax/In Basket message after today's visit.    2. Bone health:   She was given education on specific activities to promote bone health.  3. Cancer screening:  Due to Ms. Staton's history and her age, she should receive screening for skin cancers, colon cancer, and gynecologic cancers.  The information and recommendations are listed on the patient's comprehensive care plan/treatment summary and were reviewed in detail with the patient.    4. Health maintenance and wellness promotion: Ms. Samson was encouraged to consume 5-7 servings of fruits and vegetables per day. We  reviewed the "Nutrition Rainbow" handout.  She was also encouraged to engage in moderate to vigorous exercise for 30 minutes per day most days of the week. We discussed the [employer] fitness program, which is designed for cancer survivors to help them become more physically fit after cancer treatments.  She was instructed to limit her alcohol consumption and continue to abstain from tobacco use.     5. Support services/counseling: It is not uncommon for this period of the patient's cancer care trajectory to be one of many emotions and stressors.  .   She was given information regarding our available services and encouraged to contact me with any questions or for help enrolling in any of our support group/programs.    Follow up instructions:    -Return to cancer center in 6 months for f/u with Dr. Lindi Perry  -Mammogram due in 08/2021 -Follow up with surgery in one year -She is welcome to return back to the Survivorship Clinic at any time; no additional follow-up needed at this time.  -Consider referral back to survivorship as a long-term survivor for continued surveillance  The patient was provided an opportunity to ask questions and all were answered. The patient agreed with the plan and demonstrated an understanding of the instructions.   Total encounter time: 45 minutes in chart review, order entry, face-to-face visit time, care coordination, and documentation of the encounter.  Wilber Bihari, NP 03/16/21 9:41 AM Medical Oncology and Hematology [facility] [place], [place] [address] Tel. [phone]    Fax. [phone]  *Total Encounter Time as defined by the Centers for Medicare and Medicaid Services includes, in addition to the face-to-face time of a patient visit (documented in the note above) non-face-to-face time: obtaining and reviewing outside history, ordering and reviewing medications, tests or procedures, care coordination (communications with  other health care professionals or caregivers) and documentation in the medical record.

## 2021-03-16 ENCOUNTER — Encounter: Payer: Self-pay | Admitting: Adult Health

## 2021-03-16 ENCOUNTER — Inpatient Hospital Stay: Payer: Medicare PPO | Attending: Hematology and Oncology | Admitting: Adult Health

## 2021-03-16 ENCOUNTER — Other Ambulatory Visit: Payer: Self-pay

## 2021-03-16 VITALS — BP 157/82 | HR 93 | Temp 97.4°F | Resp 18 | Ht 64.0 in | Wt 186.9 lb

## 2021-03-16 DIAGNOSIS — Z923 Personal history of irradiation: Secondary | ICD-10-CM | POA: Insufficient documentation

## 2021-03-16 DIAGNOSIS — E7800 Pure hypercholesterolemia, unspecified: Secondary | ICD-10-CM | POA: Diagnosis not present

## 2021-03-16 DIAGNOSIS — Z79810 Long term (current) use of selective estrogen receptor modulators (SERMs): Secondary | ICD-10-CM | POA: Diagnosis not present

## 2021-03-16 DIAGNOSIS — K219 Gastro-esophageal reflux disease without esophagitis: Secondary | ICD-10-CM | POA: Insufficient documentation

## 2021-03-16 DIAGNOSIS — Z170 Estrogen receptor positive status [ER+]: Secondary | ICD-10-CM | POA: Insufficient documentation

## 2021-03-16 DIAGNOSIS — Z8542 Personal history of malignant neoplasm of other parts of uterus: Secondary | ICD-10-CM | POA: Insufficient documentation

## 2021-03-16 DIAGNOSIS — D0511 Intraductal carcinoma in situ of right breast: Secondary | ICD-10-CM | POA: Diagnosis not present

## 2021-03-16 NOTE — Progress Notes (Signed)
MM orders faxed to [facility] per NP. Fax conf. Received.

## 2021-04-20 ENCOUNTER — Encounter: Payer: Self-pay | Admitting: Genetic Counselor

## 2021-05-06 DIAGNOSIS — H35373 Puckering of macula, bilateral: Secondary | ICD-10-CM | POA: Diagnosis not present

## 2021-05-06 DIAGNOSIS — H3581 Retinal edema: Secondary | ICD-10-CM | POA: Diagnosis not present

## 2021-05-06 DIAGNOSIS — H43813 Vitreous degeneration, bilateral: Secondary | ICD-10-CM | POA: Diagnosis not present

## 2021-05-06 DIAGNOSIS — H2513 Age-related nuclear cataract, bilateral: Secondary | ICD-10-CM | POA: Diagnosis not present

## 2021-06-16 DIAGNOSIS — E782 Mixed hyperlipidemia: Secondary | ICD-10-CM | POA: Diagnosis not present

## 2021-06-16 DIAGNOSIS — Z1211 Encounter for screening for malignant neoplasm of colon: Secondary | ICD-10-CM | POA: Diagnosis not present

## 2021-06-16 DIAGNOSIS — Z23 Encounter for immunization: Secondary | ICD-10-CM | POA: Diagnosis not present

## 2021-06-16 DIAGNOSIS — E559 Vitamin D deficiency, unspecified: Secondary | ICD-10-CM | POA: Diagnosis not present

## 2021-06-16 DIAGNOSIS — M8588 Other specified disorders of bone density and structure, other site: Secondary | ICD-10-CM | POA: Diagnosis not present

## 2021-06-16 DIAGNOSIS — Z1389 Encounter for screening for other disorder: Secondary | ICD-10-CM | POA: Diagnosis not present

## 2021-06-16 DIAGNOSIS — Z0000 Encounter for general adult medical examination without abnormal findings: Secondary | ICD-10-CM | POA: Diagnosis not present

## 2021-06-16 DIAGNOSIS — D0511 Intraductal carcinoma in situ of right breast: Secondary | ICD-10-CM | POA: Diagnosis not present

## 2021-07-03 DIAGNOSIS — Z1211 Encounter for screening for malignant neoplasm of colon: Secondary | ICD-10-CM | POA: Diagnosis not present

## 2021-08-11 ENCOUNTER — Encounter (HOSPITAL_COMMUNITY): Payer: Self-pay

## 2021-08-21 DIAGNOSIS — Z853 Personal history of malignant neoplasm of breast: Secondary | ICD-10-CM | POA: Diagnosis not present

## 2021-08-21 DIAGNOSIS — M8589 Other specified disorders of bone density and structure, multiple sites: Secondary | ICD-10-CM | POA: Diagnosis not present

## 2021-08-28 DIAGNOSIS — M25562 Pain in left knee: Secondary | ICD-10-CM | POA: Diagnosis not present

## 2021-08-28 DIAGNOSIS — M170 Bilateral primary osteoarthritis of knee: Secondary | ICD-10-CM | POA: Diagnosis not present

## 2021-08-28 DIAGNOSIS — M25561 Pain in right knee: Secondary | ICD-10-CM | POA: Diagnosis not present

## 2021-08-31 ENCOUNTER — Telehealth: Payer: Self-pay | Admitting: Hematology and Oncology

## 2021-08-31 NOTE — Progress Notes (Incomplete)
? ?Patient Care Team: ?Donald Prose, MD as PCP - General (Family Medicine) ?Rolm Bookbinder, MD as Consulting Physician (General Surgery) ?Nicholas Lose, MD as Consulting Physician (Hematology and Oncology) ?Gery Pray, MD as Consulting Physician (Radiation Oncology) ? ?DIAGNOSIS: No diagnosis found. ? ?SUMMARY OF ONCOLOGIC HISTORY: ?Oncology History  ?Ductal carcinoma in situ (DCIS) of right breast  ?09/05/2020 Initial Diagnosis  ? Screening detected left breast calcifications 0.4 cm span, stereotactic biopsy revealed high-grade DCIS with calcifications and necrosis ER 80%, PR 5% ?  ?09/10/2020 Cancer Staging  ? Staging form: Breast, AJCC 8th Edition ?- Clinical stage from 09/10/2020: Stage 0 (cTis (DCIS), cN0, cM0, ER+, PR+, HER2: Not Assessed) - Signed by Nicholas Lose, MD on 09/10/2020 ?Stage prefix: Initial diagnosis ?Nuclear grade: G3 ?Laterality: Right ?Staged by: Pathologist and managing physician ?Stage used in treatment planning: Yes ?National guidelines used in treatment planning: Yes ?Type of national guideline used in treatment planning: NCCN ? ?  ?09/16/2020 Genetic Testing  ? Negative hereditary cancer genetic testing: no pathogenic variants detected in [facility] Panel or [facility] CancerNext-Expanded +RNAinsight Panel.  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 The report dates are Sep 16, 2020 and Sep 30, 2020.   ? ?The BRCAplus panel offered by [employer] and includes sequencing and deletion/duplication analysis for the following 8 genes: ATM, BRCA1, BRCA2, CDH1, CHEK2, PALB2, PTEN, and TP53.  The CancerNext-Expanded gene panel offered by [facility] and includes sequencing, rearrangement, and RNA analysis for the following 77 genes: AIP, ALK, APC, ATM, AXIN2, BAP1, BARD1, BLM, BMPR1A, BRCA1, BRCA2, BRIP1, CDC73, CDH1, CDK4, CDKN1B, CDKN2A, CHEK2, CTNNA1, DICER1, FANCC, FH, FLCN, GALNT12, KIF1B, LZTR1, MAX, MEN1, MET, MLH1, MSH2, MSH3, MSH6, MUTYH, NBN,  NF1, NF2, NTHL1, PALB2, PHOX2B, PMS2, POT1, PRKAR1A, PTCH1, PTEN, RAD51C, RAD51D, RB1, RECQL, RET, SDHA, SDHAF2, SDHB, SDHC, SDHD, SMAD4, SMARCA4, SMARCB1, SMARCE1, STK11, SUFU, TMEM127, TP53, TSC1, TSC2, VHL and XRCC2 (sequencing and deletion/duplication); EGFR, EGLN1, HOXB13, KIT, MITF, PDGFRA, POLD1, and POLE (sequencing only); EPCAM and GREM1 (deletion/duplication only).  ? ?The variant of uncertain significance (VUS) in PTCH1 at p.R1206C has been reclassified to likely benign.  The change in variant classification was made as a result of re-review of evidence in light of new variant interpretation guidelines and/or new information. The amended report date is April 17, 2021.  ?  ?10/08/2020 Surgery  ? Right lumpectomy [name]): high grade DCIS with necrosis, clear margins.  ?  ?10/08/2020 Cancer Staging  ? Staging form: Breast, AJCC 8th Edition ?- Pathologic stage from 10/08/2020: Stage 0 (pTis (DCIS), pN0, cM0) - Signed by Gardenia Phlegm, NP on 03/11/2021 ?Stage prefix: Initial diagnosis ? ?  ?11/18/2020 - 12/10/2020 Radiation Therapy  ? 11/18/2020 through 12/10/2020 ?Site Technique Total Dose (Gy) Dose per Fx (Gy) Completed Fx Beam Energies  ?Breast, Right: Breast_Rt 3D 42.72/42.72 2.67 16/16 6X, 10X  ? ? ?  ?01/2021 -  Anti-estrogen oral therapy  ? Tamoxifen daily x 5 years ?  ? ? ?CHIEF COMPLIANT:  Follow-up of right breast cancer ?  ? ?INTERVAL HISTORY: Monica Perry is a 66 y.o. with above-mentioned history of DCIS of the right breast.She presents to the clinic today for follow-up.   ? ? ?ALLERGIES:  is allergic to aspirin, clarithromycin, fluticasone-salmeterol, ibuprofen, red dye, septra [sulfamethoxazole-trimethoprim], stevia glycerite extract [flavoring agent], sulfa antibiotics, and sulfonamide derivatives. ? ?MEDICATIONS:  ?Current Outpatient Medications  ?Medication Sig Dispense Refill  ? Biotin 5 MG CAPS Take by mouth.    ? calcium  carbonate (TUMS - DOSED IN MG ELEMENTAL CALCIUM) 500 MG  chewable tablet Chew 1 tablet by mouth daily.    ? cholecalciferol (VITAMIN D3) 25 MCG (1000 UNIT) tablet Take 1,000 Units by mouth daily.    ? fish oil-omega-3 fatty acids 1000 MG capsule Take 2 g by mouth daily.    ? Multiple Vitamins-Minerals (MULTIVITAMIN WITH MINERALS) tablet Take 1 tablet by mouth daily.    ? pravastatin (PRAVACHOL) 20 MG tablet Take 1 tablet by mouth daily.    ? tamoxifen (NOLVADEX) 20 MG tablet Take 1 tablet (20 mg total) by mouth daily. 90 tablet 3  ? ?No current facility-administered medications for this visit.  ? ? ?PHYSICAL EXAMINATION: ?ECOG PERFORMANCE STATUS: {CHL ONC ECOG GG:[id number]} ? ?There were no vitals filed for this visit. ?There were no vitals filed for this visit. ? ?BREAST:*** No palpable masses or nodules in either right or left breasts. No palpable axillary supraclavicular or infraclavicular adenopathy no breast tenderness or nipple discharge. (exam performed in the presence of a chaperone) ? ?LABORATORY DATA:  ?I have reviewed the data as listed ? ?  Latest Ref Rng & Units 09/10/2020  ?  8:22 AM 01/03/2015  ?  9:12 AM  ?CMP  ?Glucose 70 - 99 mg/dL 102   108    ?BUN 8 - 23 mg/dL 12   17    ?Creatinine 0.44 - 1.00 mg/dL 0.73   0.70    ?Sodium 135 - 145 mmol/L 142   135    ?Potassium 3.5 - 5.1 mmol/L 3.9   4.3    ?Chloride 98 - 111 mmol/L 105   103    ?CO2 22 - 32 mmol/L 26     ?Calcium 8.9 - 10.3 mg/dL 10.4     ?Total Protein 6.5 - 8.1 g/dL 7.2     ?Total Bilirubin 0.3 - 1.2 mg/dL 0.6     ?Alkaline Phos 38 - 126 U/L 79     ?AST 15 - 41 U/L 19     ?ALT 0 - 44 U/L 16     ? ? ?Lab Results  ?Component Value Date  ? WBC 7.2 09/10/2020  ? HGB 14.3 09/10/2020  ? HCT 42.8 09/10/2020  ? MCV 88.2 09/10/2020  ? PLT 260 09/10/2020  ? NEUTROABS 5.5 09/10/2020  ? ? ?ASSESSMENT & PLAN:  ?No problem-specific Assessment & Plan notes found for this encounter. ? ? ? ?No orders of the defined types were placed in this encounter. ? ?The patient has a good understanding of the overall plan. she  agrees with it. she will call with any problems that may develop before the next visit here. ?Total time spent: 30 mins including face to face time and time spent for planning, charting and co-ordination of care ? ? Suzzette Righter, CMA ?08/31/21 ? ? ? I Monica Perry, Monica Perry am scribing for Dr. Lindi Adie ? ?***  ?

## 2021-08-31 NOTE — Telephone Encounter (Signed)
Called patient regarding upcoming appointments, patient is notified. 

## 2021-09-09 ENCOUNTER — Telehealth: Payer: Self-pay | Admitting: Hematology and Oncology

## 2021-09-09 NOTE — Telephone Encounter (Signed)
.  Called pt per 5/10 inb , Patient was unavailable, a message with appt time and date was left with number on file.   ?

## 2021-09-14 ENCOUNTER — Inpatient Hospital Stay: Payer: Medicare PPO | Admitting: Hematology and Oncology

## 2021-10-09 DIAGNOSIS — M1711 Unilateral primary osteoarthritis, right knee: Secondary | ICD-10-CM | POA: Diagnosis not present

## 2021-10-09 NOTE — Progress Notes (Signed)
DUE TO COVID-19 ONLY  2 VISITOR IS ALLOWED TO COME WITH YOU AND STAY IN THE WAITING ROOM ONLY DURING PRE OP AND PROCEDURE DAY OF SURGERY.   4 VISITOR  MAY VISIT WITH YOU AFTER SURGERY IN YOUR PRIVATE ROOM DURING VISITING HOURS ONLY! YOU MAY HAVE ONE PERSON SPEND THE NITE WITH YOU IN YOUR ROOM AFTER SURGERY.     Your procedure is scheduled on:         10/20/2021   Report to [facility] Main  Entrance   Report to admitting at   1000              AM DO NOT BRING INSURANCE CARD, PICTURE ID OR WALLET DAY OF SURGERY.      Call this number if you have problems the morning of surgery [phone]    REMEMBER: NO  SOLID FOODS , CANDY, GUM OR MINTS AFTER [place] .       Marland Kitchen CLEAR LIQUIDS UNTIL      0945am           DAY OF SURGERY.      PLEASE FINISH ENSURE DRINK PER SURGEON ORDER  WHICH NEEDS TO BE COMPLETED AT      0945am      MORNING OF SURGERY.       CLEAR LIQUID DIET   Foods Allowed      WATER BLACK COFFEE ( SUGAR OK, NO MILK, CREAM OR CREAMER) REGULAR AND DECAF  TEA ( SUGAR OK NO MILK, CREAM, OR CREAMER) REGULAR AND DECAF  PLAIN JELLO ( NO RED)  FRUIT ICES ( NO RED, NO FRUIT PULP)  POPSICLES ( NO RED)  JUICE- APPLE, WHITE GRAPE AND WHITE CRANBERRY  SPORT DRINK LIKE GATORADE ( NO RED)  CLEAR BROTH ( VEGETABLE , CHICKEN OR BEEF)                                                                     BRUSH YOUR TEETH MORNING OF SURGERY AND RINSE YOUR MOUTH OUT, NO CHEWING GUM CANDY OR MINTS.     Take these medicines the morning of surgery with A SIP OF WATER:  none    DO NOT TAKE ANY DIABETIC MEDICATIONS DAY OF YOUR SURGERY                               You may not have any metal on your body including hair pins and              piercings  Do not wear jewelry, make-up, lotions, powders or perfumes, deodorant             Do not wear nail polish on your fingernails.              IF YOU ARE A FEMALE AND WANT TO SHAVE UNDER ARMS OR LEGS PRIOR TO SURGERY YOU  MUST DO SO AT LEAST 48 HOURS PRIOR TO SURGERY.              Men may shave face and neck.   Do not bring valuables to the hospital. [facility] IS NOT  RESPONSIBLE   FOR VALUABLES.  Contacts, dentures or bridgework may not be worn into surgery.  Leave suitcase in the car. After surgery it may be brought to your room.     Patients discharged the day of surgery will not be allowed to drive home. IF YOU ARE HAVING SURGERY AND GOING HOME THE SAME DAY, YOU MUST HAVE AN ADULT TO DRIVE YOU HOME AND BE WITH YOU FOR 24 HOURS. YOU MAY GO HOME BY TAXI OR UBER OR ORTHERWISE, BUT AN ADULT MUST ACCOMPANY YOU HOME AND STAY WITH YOU FOR 24 HOURS.                Please read over the following fact sheets you were given: _____________________________________________________________________  [facility] - Preparing for Surgery Before surgery, you can play an important role.  Because skin is not sterile, your skin needs to be as free of germs as possible.  You can reduce the number of germs on your skin by washing with CHG (chlorahexidine gluconate) soap before surgery.  CHG is an antiseptic cleaner which kills germs and bonds with the skin to continue killing germs even after washing. Please DO NOT use if you have an allergy to CHG or antibacterial soaps.  If your skin becomes reddened/irritated stop using the CHG and inform your nurse when you arrive at Short Stay. Do not shave (including legs and underarms) for at least 48 hours prior to the first CHG shower.  You may shave your face/neck. Please follow these instructions carefully:  1.  Shower with CHG Soap the night before surgery and the  morning of Surgery.  2.  If you choose to wash your hair, wash your hair first as usual with your  normal  shampoo.  3.  After you shampoo, rinse your hair and body thoroughly to remove the  shampoo.                           4.  Use CHG as you would any other liquid soap.  You can apply chg directly  to the skin  and wash                       Gently with a scrungie or clean washcloth.  5.  Apply the CHG Soap to your body ONLY FROM THE NECK DOWN.   Do not use on face/ open                           Wound or open sores. Avoid contact with eyes, ears mouth and genitals (private parts).                       Wash face,  Genitals (private parts) with your normal soap.             6.  Wash thoroughly, paying special attention to the area where your surgery  will be performed.  7.  Thoroughly rinse your body with warm water from the neck down.  8.  DO NOT shower/wash with your normal soap after using and rinsing off  the CHG Soap.                9.  Pat yourself dry with a clean towel.            10.  Wear clean pajamas.  11.  Place clean sheets on your bed the night of your first shower and do not  sleep with pets. Day of Surgery : Do not apply any lotions/deodorants the morning of surgery.  Please wear clean clothes to the hospital/surgery center.  FAILURE TO FOLLOW THESE INSTRUCTIONS MAY RESULT IN THE CANCELLATION OF YOUR SURGERY PATIENT SIGNATURE_________________________________  NURSE SIGNATURE__________________________________  ________________________________________________________________________

## 2021-10-09 NOTE — Progress Notes (Signed)
Anesthesia Review:  PCP: Cardiologist : Chest x-ray : EKG : Echo : Stress test: Cardiac Cath :  Activity level:  Sleep Study/ CPAP : Fasting Blood Sugar :      / Checks Blood Sugar -- times a day:   Blood Thinner/ Instructions /Last Dose: ASA / Instructions/ Last Dose :  

## 2021-10-12 ENCOUNTER — Other Ambulatory Visit: Payer: Self-pay

## 2021-10-12 ENCOUNTER — Encounter (HOSPITAL_COMMUNITY): Payer: Self-pay

## 2021-10-12 ENCOUNTER — Encounter (HOSPITAL_COMMUNITY)
Admission: RE | Admit: 2021-10-12 | Discharge: 2021-10-12 | Disposition: A | Discharge status: Home / Self Care | Payer: Medicare PPO | Source: Ambulatory Visit | Attending: Orthopedic Surgery | Admitting: Orthopedic Surgery

## 2021-10-12 VITALS — BP 137/86 | HR 78 | Temp 99.0°F | Resp 16 | Ht 64.0 in | Wt 179.0 lb

## 2021-10-12 DIAGNOSIS — Z01818 Encounter for other preprocedural examination: Secondary | ICD-10-CM

## 2021-10-12 DIAGNOSIS — M1711 Unilateral primary osteoarthritis, right knee: Secondary | ICD-10-CM | POA: Diagnosis not present

## 2021-10-12 DIAGNOSIS — Z01812 Encounter for preprocedural laboratory examination: Secondary | ICD-10-CM | POA: Insufficient documentation

## 2021-10-12 LAB — CBC
HCT: 43.3 % (ref 36.0–46.0)
Hemoglobin: 14 g/dL (ref 12.0–15.0)
MCH: 29.7 pg (ref 26.0–34.0)
MCHC: 32.3 g/dL (ref 30.0–36.0)
MCV: 91.9 fL (ref 80.0–100.0)
Platelets: 242 10*3/uL (ref 150–400)
RBC: 4.71 MIL/uL (ref 3.87–5.11)
RDW: 13.2 % (ref 11.5–15.5)
WBC: 6.5 10*3/uL (ref 4.0–10.5)
nRBC: 0 % (ref 0.0–0.2)

## 2021-10-12 LAB — SURGICAL PCR SCREEN
MRSA, PCR: NEGATIVE
Staphylococcus aureus: POSITIVE — AB

## 2021-10-19 ENCOUNTER — Encounter (HOSPITAL_COMMUNITY): Payer: Self-pay | Admitting: Orthopedic Surgery

## 2021-10-20 ENCOUNTER — Other Ambulatory Visit: Payer: Self-pay

## 2021-10-20 ENCOUNTER — Ambulatory Visit (HOSPITAL_BASED_OUTPATIENT_CLINIC_OR_DEPARTMENT_OTHER): Payer: Medicare PPO | Admitting: Anesthesiology

## 2021-10-20 ENCOUNTER — Ambulatory Visit (HOSPITAL_COMMUNITY): Payer: Medicare PPO | Admitting: Physician Assistant

## 2021-10-20 ENCOUNTER — Encounter (HOSPITAL_COMMUNITY)
Admission: RE | Disposition: A | Discharge status: Home / Self Care | Payer: Self-pay | Source: Home / Self Care | Attending: Orthopedic Surgery

## 2021-10-20 ENCOUNTER — Encounter (HOSPITAL_COMMUNITY): Payer: Self-pay | Admitting: Orthopedic Surgery

## 2021-10-20 ENCOUNTER — Observation Stay (HOSPITAL_COMMUNITY)
Admission: RE | Admit: 2021-10-20 | Discharge: 2021-10-21 | Disposition: A | Discharge status: Home / Self Care | Payer: Medicare PPO | Attending: Orthopedic Surgery | Admitting: Orthopedic Surgery

## 2021-10-20 DIAGNOSIS — Z8542 Personal history of malignant neoplasm of other parts of uterus: Secondary | ICD-10-CM | POA: Insufficient documentation

## 2021-10-20 DIAGNOSIS — Z79899 Other long term (current) drug therapy: Secondary | ICD-10-CM | POA: Diagnosis not present

## 2021-10-20 DIAGNOSIS — Z96651 Presence of right artificial knee joint: Secondary | ICD-10-CM

## 2021-10-20 DIAGNOSIS — M1711 Unilateral primary osteoarthritis, right knee: Secondary | ICD-10-CM

## 2021-10-20 DIAGNOSIS — G8918 Other acute postprocedural pain: Secondary | ICD-10-CM | POA: Diagnosis not present

## 2021-10-20 DIAGNOSIS — Z853 Personal history of malignant neoplasm of breast: Secondary | ICD-10-CM | POA: Diagnosis not present

## 2021-10-20 DIAGNOSIS — Z01818 Encounter for other preprocedural examination: Secondary | ICD-10-CM

## 2021-10-20 HISTORY — PX: TOTAL KNEE ARTHROPLASTY: SHX125

## 2021-10-20 LAB — TYPE AND SCREEN
ABO/RH(D): O POS
Antibody Screen: NEGATIVE

## 2021-10-20 LAB — ABO/RH: ABO/RH(D): O POS

## 2021-10-20 SURGERY — ARTHROPLASTY, KNEE, TOTAL
Anesthesia: Spinal | Site: Knee | Laterality: Right

## 2021-10-20 MED ORDER — OXYCODONE HCL 5 MG PO TABS
5.0000 mg | ORAL_TABLET | ORAL | Status: DC | PRN
  Administered 2021-10-20: 10 mg via ORAL
  Administered 2021-10-20: 5 mg via ORAL
  Filled 2021-10-20 (×2): qty 2
  Filled 2021-10-20: qty 1

## 2021-10-20 MED ORDER — SODIUM CHLORIDE (PF) 0.9 % IJ SOLN
INTRAMUSCULAR | Status: AC
  Filled 2021-10-20: qty 30

## 2021-10-20 MED ORDER — TRANEXAMIC ACID-NACL 1000-0.7 MG/100ML-% IV SOLN
1000.0000 mg | Freq: Once | INTRAVENOUS | Status: AC
  Administered 2021-10-20: 1000 mg via INTRAVENOUS
  Filled 2021-10-20: qty 100

## 2021-10-20 MED ORDER — ASPIRIN 81 MG PO CHEW
81.0000 mg | CHEWABLE_TABLET | Freq: Two times a day (BID) | ORAL | Status: DC
  Administered 2021-10-20 – 2021-10-21 (×2): 81 mg via ORAL
  Filled 2021-10-20 (×2): qty 1

## 2021-10-20 MED ORDER — DEXAMETHASONE SODIUM PHOSPHATE 10 MG/ML IJ SOLN
10.0000 mg | Freq: Once | INTRAMUSCULAR | Status: AC
  Administered 2021-10-21: 10 mg via INTRAVENOUS
  Filled 2021-10-20: qty 1

## 2021-10-20 MED ORDER — SODIUM CHLORIDE (PF) 0.9 % IJ SOLN
INTRAMUSCULAR | Status: DC | PRN
  Administered 2021-10-20: 30 mL

## 2021-10-20 MED ORDER — METHOCARBAMOL 500 MG PO TABS
500.0000 mg | ORAL_TABLET | Freq: Four times a day (QID) | ORAL | Status: DC | PRN
  Administered 2021-10-20: 500 mg via ORAL
  Filled 2021-10-20: qty 1

## 2021-10-20 MED ORDER — PHENOL 1.4 % MT LIQD: 1.0000 | OROMUCOSAL | Status: DC | PRN

## 2021-10-20 MED ORDER — CEFAZOLIN SODIUM-DEXTROSE 2-4 GM/100ML-% IV SOLN
2.0000 g | INTRAVENOUS | Status: AC
  Administered 2021-10-20: 2 g via INTRAVENOUS
  Filled 2021-10-20: qty 100

## 2021-10-20 MED ORDER — ONDANSETRON HCL 4 MG/2ML IJ SOLN
INTRAMUSCULAR | Status: DC | PRN
  Administered 2021-10-20: 4 mg via INTRAVENOUS

## 2021-10-20 MED ORDER — METHOCARBAMOL 500 MG IVPB - SIMPLE MED: 500.0000 mg | Freq: Four times a day (QID) | INTRAVENOUS | Status: DC | PRN

## 2021-10-20 MED ORDER — BISACODYL 10 MG RE SUPP
10.0000 mg | Freq: Every day | RECTAL | Status: DC | PRN
Start: 2021-10-20 — End: 2021-10-21

## 2021-10-20 MED ORDER — ACETAMINOPHEN 500 MG PO TABS: 1000.0000 mg | ORAL_TABLET | Freq: Once | ORAL | Status: DC

## 2021-10-20 MED ORDER — LIDOCAINE 2% (20 MG/ML) 5 ML SYRINGE
INTRAMUSCULAR | Status: DC | PRN
  Administered 2021-10-20: 50 mg via INTRAVENOUS

## 2021-10-20 MED ORDER — TRANEXAMIC ACID-NACL 1000-0.7 MG/100ML-% IV SOLN
1000.0000 mg | INTRAVENOUS | Status: AC
  Administered 2021-10-20: 1000 mg via INTRAVENOUS
  Filled 2021-10-20: qty 100

## 2021-10-20 MED ORDER — DEXAMETHASONE SODIUM PHOSPHATE 10 MG/ML IJ SOLN: 8.0000 mg | Freq: Once | INTRAMUSCULAR | Status: DC

## 2021-10-20 MED ORDER — PHENYLEPHRINE HCL-NACL 20-0.9 MG/250ML-% IV SOLN
INTRAVENOUS | Status: DC | PRN
  Administered 2021-10-20: 15 ug/min via INTRAVENOUS

## 2021-10-20 MED ORDER — 0.9 % SODIUM CHLORIDE (POUR BTL) OPTIME
TOPICAL | Status: DC | PRN
  Administered 2021-10-20: 1000 mL

## 2021-10-20 MED ORDER — ONDANSETRON HCL 4 MG/2ML IJ SOLN: 4.0000 mg | Freq: Four times a day (QID) | INTRAMUSCULAR | Status: DC | PRN

## 2021-10-20 MED ORDER — TAMOXIFEN CITRATE 10 MG PO TABS
20.0000 mg | ORAL_TABLET | Freq: Every evening | ORAL | Status: DC
Start: 2021-10-20 — End: 2021-10-21
  Administered 2021-10-20: 20 mg via ORAL
  Filled 2021-10-20 (×2): qty 2

## 2021-10-20 MED ORDER — MIDAZOLAM HCL 5 MG/5ML IJ SOLN
INTRAMUSCULAR | Status: DC | PRN
  Administered 2021-10-20: 1 mg via INTRAVENOUS

## 2021-10-20 MED ORDER — SODIUM CHLORIDE 0.9 % IV SOLN: INTRAVENOUS | Status: DC

## 2021-10-20 MED ORDER — ONDANSETRON HCL 4 MG/2ML IJ SOLN
4.0000 mg | Freq: Once | INTRAMUSCULAR | Status: DC | PRN
Start: 2021-10-20 — End: 2021-10-20

## 2021-10-20 MED ORDER — PROPOFOL 500 MG/50ML IV EMUL
INTRAVENOUS | Status: DC | PRN
  Administered 2021-10-20: 75 ug/kg/min via INTRAVENOUS

## 2021-10-20 MED ORDER — DOCUSATE SODIUM 100 MG PO CAPS: 100.0000 mg | ORAL_CAPSULE | Freq: Two times a day (BID) | ORAL | Status: DC

## 2021-10-20 MED ORDER — BUPIVACAINE-EPINEPHRINE (PF) 0.25% -1:200000 IJ SOLN
INTRAMUSCULAR | Status: AC
  Filled 2021-10-20: qty 30

## 2021-10-20 MED ORDER — PHENYLEPHRINE 80 MCG/ML (10ML) SYRINGE FOR IV PUSH (FOR BLOOD PRESSURE SUPPORT)
PREFILLED_SYRINGE | INTRAVENOUS | Status: AC
  Filled 2021-10-20: qty 10

## 2021-10-20 MED ORDER — KETOROLAC TROMETHAMINE 30 MG/ML IJ SOLN
INTRAMUSCULAR | Status: DC | PRN
  Administered 2021-10-20: 30 mg

## 2021-10-20 MED ORDER — ROPIVACAINE HCL 5 MG/ML IJ SOLN
INTRAMUSCULAR | Status: DC | PRN
  Administered 2021-10-20: 30 mL via PERINEURAL

## 2021-10-20 MED ORDER — FENTANYL CITRATE PF 50 MCG/ML IJ SOSY
PREFILLED_SYRINGE | INTRAMUSCULAR | Status: AC
  Administered 2021-10-20: 50 ug via INTRAVENOUS
  Filled 2021-10-20: qty 2

## 2021-10-20 MED ORDER — ACETAMINOPHEN 325 MG PO TABS: 325.0000 mg | ORAL_TABLET | Freq: Four times a day (QID) | ORAL | Status: DC | PRN

## 2021-10-20 MED ORDER — ORAL CARE MOUTH RINSE
15.0000 mL | Freq: Once | OROMUCOSAL | Status: AC
  Administered 2021-10-20: 15 mL via OROMUCOSAL

## 2021-10-20 MED ORDER — DEXAMETHASONE SODIUM PHOSPHATE 10 MG/ML IJ SOLN
INTRAMUSCULAR | Status: DC | PRN
  Administered 2021-10-20: 4 mg via INTRAVENOUS

## 2021-10-20 MED ORDER — CEFAZOLIN SODIUM-DEXTROSE 2-4 GM/100ML-% IV SOLN
2.0000 g | Freq: Four times a day (QID) | INTRAVENOUS | Status: AC
  Administered 2021-10-20 – 2021-10-21 (×2): 2 g via INTRAVENOUS
  Filled 2021-10-20 (×2): qty 100

## 2021-10-20 MED ORDER — MIDAZOLAM HCL 2 MG/2ML IJ SOLN
1.0000 mg | INTRAMUSCULAR | Status: DC
  Administered 2021-10-20: 1 mg via INTRAVENOUS
  Filled 2021-10-20: qty 2

## 2021-10-20 MED ORDER — LACTATED RINGERS IV SOLN: INTRAVENOUS | Status: DC

## 2021-10-20 MED ORDER — MENTHOL 3 MG MT LOZG: 1.0000 | LOZENGE | OROMUCOSAL | Status: DC | PRN

## 2021-10-20 MED ORDER — POLYETHYLENE GLYCOL 3350 17 G PO PACK: 17.0000 g | PACK | Freq: Every day | ORAL | Status: DC | PRN

## 2021-10-20 MED ORDER — ACETAMINOPHEN 500 MG PO TABS
1000.0000 mg | ORAL_TABLET | Freq: Four times a day (QID) | ORAL | Status: DC
  Administered 2021-10-20 – 2021-10-21 (×3): 1000 mg via ORAL
  Filled 2021-10-20 (×5): qty 2

## 2021-10-20 MED ORDER — PROPOFOL 10 MG/ML IV BOLUS
INTRAVENOUS | Status: DC | PRN
  Administered 2021-10-20: 10 mg via INTRAVENOUS
  Administered 2021-10-20 (×2): 20 mg via INTRAVENOUS
  Administered 2021-10-20: 30 mg via INTRAVENOUS
  Administered 2021-10-20: 20 mg via INTRAVENOUS

## 2021-10-20 MED ORDER — SODIUM CHLORIDE 0.9 % IR SOLN
Status: DC | PRN
  Administered 2021-10-20: 1000 mL

## 2021-10-20 MED ORDER — FENTANYL CITRATE PF 50 MCG/ML IJ SOSY: 25.0000 ug | PREFILLED_SYRINGE | INTRAMUSCULAR | Status: DC | PRN

## 2021-10-20 MED ORDER — POVIDONE-IODINE 10 % EX SWAB: 2.0000 | Freq: Once | CUTANEOUS | Status: DC

## 2021-10-20 MED ORDER — CHLORHEXIDINE GLUCONATE 0.12 % MT SOLN: 15.0000 mL | Freq: Once | OROMUCOSAL | Status: DC

## 2021-10-20 MED ORDER — ONDANSETRON HCL 4 MG PO TABS: 4.0000 mg | ORAL_TABLET | Freq: Four times a day (QID) | ORAL | Status: DC | PRN

## 2021-10-20 MED ORDER — MIDAZOLAM HCL 2 MG/2ML IJ SOLN
INTRAMUSCULAR | Status: AC
  Filled 2021-10-20: qty 2

## 2021-10-20 MED ORDER — METOCLOPRAMIDE HCL 5 MG/ML IJ SOLN: 5.0000 mg | Freq: Three times a day (TID) | INTRAMUSCULAR | Status: DC | PRN

## 2021-10-20 MED ORDER — OXYCODONE HCL 5 MG PO TABS
10.0000 mg | ORAL_TABLET | ORAL | Status: DC | PRN
  Administered 2021-10-21: 15 mg via ORAL
  Administered 2021-10-21: 10 mg via ORAL
  Administered 2021-10-21 (×2): 15 mg via ORAL
  Filled 2021-10-20 (×3): qty 3
  Filled 2021-10-20: qty 2

## 2021-10-20 MED ORDER — FENTANYL CITRATE PF 50 MCG/ML IJ SOSY: 50.0000 ug | PREFILLED_SYRINGE | INTRAMUSCULAR | Status: DC

## 2021-10-20 MED ORDER — ACETAMINOPHEN 10 MG/ML IV SOLN
1000.0000 mg | Freq: Once | INTRAVENOUS | Status: AC
  Administered 2021-10-20: 1000 mg via INTRAVENOUS
  Filled 2021-10-20: qty 100

## 2021-10-20 MED ORDER — HYDROMORPHONE HCL 1 MG/ML IJ SOLN
0.5000 mg | INTRAMUSCULAR | Status: DC | PRN
  Administered 2021-10-20: 1 mg via INTRAVENOUS
  Filled 2021-10-20: qty 1

## 2021-10-20 MED ORDER — KETOROLAC TROMETHAMINE 30 MG/ML IJ SOLN
INTRAMUSCULAR | Status: AC
  Filled 2021-10-20: qty 1

## 2021-10-20 MED ORDER — METOCLOPRAMIDE HCL 5 MG PO TABS: 5.0000 mg | ORAL_TABLET | Freq: Three times a day (TID) | ORAL | Status: DC | PRN

## 2021-10-20 MED ORDER — BUPIVACAINE-EPINEPHRINE (PF) 0.25% -1:200000 IJ SOLN
INTRAMUSCULAR | Status: DC | PRN
  Administered 2021-10-20: 30 mL

## 2021-10-20 MED ORDER — PRAVASTATIN SODIUM 20 MG PO TABS
20.0000 mg | ORAL_TABLET | Freq: Every evening | ORAL | Status: DC
  Administered 2021-10-20: 20 mg via ORAL
  Filled 2021-10-20: qty 1

## 2021-10-20 MED ORDER — POVIDONE-IODINE 10 % EX SWAB: 2.0000 "application " | Freq: Once | CUTANEOUS | Status: DC

## 2021-10-20 SURGICAL SUPPLY — 62 items
ADH SKN CLS APL DERMABOND .7 (GAUZE/BANDAGES/DRESSINGS) ×1
ATTUNE MED ANAT PAT 35 KNEE (Knees) ×1 IMPLANT
ATTUNE PS FEM RT SZ 4 CEM KNEE (Femur) ×1 IMPLANT
ATTUNE PSRP INSR SZ4 8 KNEE (Insert) ×1 IMPLANT
BAG COUNTER SPONGE SURGICOUNT (BAG) IMPLANT
BAG SPEC THK2 15X12 ZIP CLS (MISCELLANEOUS) ×1
BAG SPNG CNTER NS LX DISP (BAG)
BAG ZIPLOCK 12X15 (MISCELLANEOUS) ×1 IMPLANT
BASE TIBIAL ROT PLAT SZ 5 KNEE (Knees) IMPLANT
BLADE SAW SGTL 11.0X1.19X90.0M (BLADE) IMPLANT
BLADE SAW SGTL 13.0X1.19X90.0M (BLADE) ×3 IMPLANT
BNDG ELASTIC 6X5.8 VLCR STR LF (GAUZE/BANDAGES/DRESSINGS) ×3 IMPLANT
BOWL SMART MIX CTS (DISPOSABLE) ×3 IMPLANT
BSPLAT TIB 5 CMNT ROT PLAT STR (Knees) ×1 IMPLANT
CEMENT HV SMART SET (Cement) ×2 IMPLANT
CUFF TOURN SGL QUICK 34 (TOURNIQUET CUFF)
CUFF TOURN SGL QUICK 42 (TOURNIQUET CUFF) ×1 IMPLANT
CUFF TRNQT CYL 34X4.125X (TOURNIQUET CUFF) ×2 IMPLANT
DERMABOND ADVANCED (GAUZE/BANDAGES/DRESSINGS) ×1
DERMABOND ADVANCED .7 DNX12 (GAUZE/BANDAGES/DRESSINGS) ×2 IMPLANT
DRAPE SHEET LG 3/4 BI-LAMINATE (DRAPES) ×3 IMPLANT
DRAPE U-SHAPE 47X51 STRL (DRAPES) ×3 IMPLANT
DRESSING AQUACEL AG SP 3.5X10 (GAUZE/BANDAGES/DRESSINGS) ×2 IMPLANT
DRSG AQUACEL AG SP 3.5X10 (GAUZE/BANDAGES/DRESSINGS) ×2
DURAPREP 26ML APPLICATOR (WOUND CARE) ×6 IMPLANT
ELECT REM PT RETURN 15FT ADLT (MISCELLANEOUS) ×3 IMPLANT
FACESHIELD WRAPAROUND (MASK) ×2 IMPLANT
FACESHIELD WRAPAROUND OR TEAM (MASK) ×2 IMPLANT
GLOVE BIO SURGEON STRL SZ 6 (GLOVE) ×3 IMPLANT
GLOVE BIOGEL PI IND STRL 6.5 (GLOVE) ×2 IMPLANT
GLOVE BIOGEL PI IND STRL 7.5 (GLOVE) ×2 IMPLANT
GLOVE BIOGEL PI INDICATOR 6.5 (GLOVE) ×1
GLOVE BIOGEL PI INDICATOR 7.5 (GLOVE) ×1
GLOVE ORTHO TXT STRL SZ7.5 (GLOVE) ×6 IMPLANT
GOWN STRL REUS W/ TWL LRG LVL3 (GOWN DISPOSABLE) ×6 IMPLANT
GOWN STRL REUS W/TWL LRG LVL3 (GOWN DISPOSABLE) ×6
HANDPIECE INTERPULSE COAX TIP (DISPOSABLE) ×2
HOLDER FOLEY CATH W/STRAP (MISCELLANEOUS) ×1 IMPLANT
KIT TURNOVER KIT A (KITS) IMPLANT
MANIFOLD NEPTUNE II (INSTRUMENTS) ×3 IMPLANT
NDL SAFETY ECLIPSE 18X1.5 (NEEDLE) IMPLANT
NEEDLE HYPO 18GX1.5 SHARP (NEEDLE)
NS IRRIG 1000ML POUR BTL (IV SOLUTION) ×3 IMPLANT
PACK TOTAL KNEE CUSTOM (KITS) ×3 IMPLANT
PROTECTOR NERVE ULNAR (MISCELLANEOUS) ×3 IMPLANT
SET HNDPC FAN SPRY TIP SCT (DISPOSABLE) ×2 IMPLANT
SET PAD KNEE POSITIONER (MISCELLANEOUS) ×3 IMPLANT
SPIKE FLUID TRANSFER (MISCELLANEOUS) ×4 IMPLANT
SUT MNCRL AB 4-0 PS2 18 (SUTURE) ×3 IMPLANT
SUT STRATAFIX PDS+ 0 24IN (SUTURE) ×3 IMPLANT
SUT VIC AB 1 CT1 36 (SUTURE) ×3 IMPLANT
SUT VIC AB 2-0 CT1 27 (SUTURE) ×4
SUT VIC AB 2-0 CT1 TAPERPNT 27 (SUTURE) ×4 IMPLANT
SYR 3ML LL SCALE MARK (SYRINGE) ×3 IMPLANT
TIBIAL BASE ROT PLAT SZ 5 KNEE (Knees) ×2 IMPLANT
TOWEL GREEN STERILE FF (TOWEL DISPOSABLE) ×3 IMPLANT
TRAY CATH INTERMITTENT SS 16FR (CATHETERS) ×2 IMPLANT
TRAY FOLEY MTR SLVR 14FR STAT (SET/KITS/TRAYS/PACK) ×1 IMPLANT
TRAY FOLEY MTR SLVR 16FR STAT (SET/KITS/TRAYS/PACK) ×2 IMPLANT
TUBE SUCTION HIGH CAP CLEAR NV (SUCTIONS) ×3 IMPLANT
WATER STERILE IRR 1000ML POUR (IV SOLUTION) ×6 IMPLANT
WRAP KNEE MAXI GEL POST OP (GAUZE/BANDAGES/DRESSINGS) ×3 IMPLANT

## 2021-10-20 NOTE — Discharge Instructions (Signed)

## 2021-10-20 NOTE — Op Note (Signed)
NAME:  Monica Perry                      MEDICAL RECORD NO.:  [id number]                             FACILITY:  [facility]      PHYSICIAN:  Pietro Cassis. Alvan Dame, M.D.  DATE OF BIRTH:  12-06-1955      DATE OF PROCEDURE:  10/20/2021                                     OPERATIVE REPORT         PREOPERATIVE DIAGNOSIS:  Right knee osteoarthritis.      POSTOPERATIVE DIAGNOSIS:  Right knee osteoarthritis.      FINDINGS:  The patient was noted to have complete loss of cartilage and   bone-on-bone arthritis with associated osteophytes in the medial and patellofemoral compartments of   the knee with a significant synovitis and associated effusion.  The patient had failed months of conservative treatment including medications, injection therapy, activity modification.     PROCEDURE:  Right total knee replacement.      COMPONENTS USED:  DePuy Attune rotating platform posterior stabilized knee   system, a size 4 femur, 5 tibia, size 8 mm PS AOX insert, and 35 anatomic patellar   button.      SURGEON:  Pietro Cassis. Alvan Dame, M.D.      ASSISTANT:  Costella Hatcher, PA-C.      ANESTHESIA:  Regional and Spinal.      SPECIMENS:  None.      COMPLICATION:  None.      DRAINS:  None.  EBL: <150 cc      TOURNIQUET TIME:   Total Tourniquet Time Documented: Thigh (Right) - 26 minutes Total: Thigh (Right) - 26 minutes  .      The patient was stable to the recovery room.      INDICATION FOR PROCEDURE:  Monica Perry is a 66 y.o. female patient of   mine.  The patient had been seen, evaluated, and treated for months conservatively in the   office with medication, activity modification, and injections.  The patient had   radiographic changes of bone-on-bone arthritis with endplate sclerosis and osteophytes noted.  Based on the radiographic changes and failed conservative measures, the patient   decided to proceed with definitive treatment, total knee replacement.  Risks of infection, DVT, component failure,  need for revision surgery, neurovascular injury were reviewed in the office setting.  The postop course was reviewed stressing the efforts to maximize post-operative satisfaction and function.  Consent was obtained for benefit of pain   relief.      PROCEDURE IN DETAIL:  The patient was brought to the operative theater.   Once adequate anesthesia, preoperative antibiotics, 2 gm of Ancef,1 gm of Tranexamic Acid, and 10 mg of Decadron administered, the patient was positioned supine with a right thigh tourniquet placed.  The  right lower extremity was prepped and draped in sterile fashion.  A time-   out was performed identifying the patient, planned procedure, and the appropriate extremity.      The right lower extremity was placed in the [facility] leg holder.  The leg was   exsanguinated, tourniquet elevated to 250 mmHg.  A midline incision was  made followed by median parapatellar arthrotomy.  Following initial   exposure, attention was first directed to the patella.  Precut   measurement was noted to be 22 mm.  I resected down to 13 mm and used a   35 anatomic patellar button to restore patellar height as well as cover the cut surface.      The lug holes were drilled and a metal shim was placed to protect the   patella from retractors and saw blade during the procedure.      At this point, attention was now directed to the femur.  The femoral   canal was opened with a drill, irrigated to try to prevent fat emboli.  An   intramedullary rod was passed at 3 degrees valgus, 9 mm of bone was   resected off the distal femur.  Following this resection, the tibia was   subluxated anteriorly.  Using the extramedullary guide, 2 mm of bone was resected off   the proximal medial tibia.  We confirmed the gap would be   stable medially and laterally with a size 5 spacer block as well as confirmed that the tibial cut was perpendicular in the coronal plane, checking with an alignment rod.      Once this was  done, I sized the femur to be a size 4 in the anterior-   posterior dimension, chose a standard component based on medial and   lateral dimension.  The size 4 rotation block was then pinned in   position anterior referenced using the C-clamp to set rotation.  The   anterior, posterior, and  chamfer cuts were made without difficulty nor   notching making certain that I was along the anterior cortex to help   with flexion gap stability.      The final box cut was made off the lateral aspect of distal femur.      At this point, the tibia was sized to be a size 5.  The size 5 tray was   then pinned in position through the medial third of the tubercle,   drilled, and keel punched.  Trial reduction was now carried with a 4 femur,  5 tibia, a size 8 mm PS insert, and the 35 anatomic patella botton.  The knee was brought to full extension with good flexion stability with the patella   tracking through the trochlea without application of pressure.  Given   all these findings the trial components removed.  Final components were   opened and cement was mixed.  The knee was irrigated with normal saline solution and pulse lavage.  The synovial lining was   then injected with 30 cc of 0.25% Marcaine with epinephrine, 1 cc of Toradol and 30 cc of NS for a total of 61 cc.     Final implants were then cemented onto cleaned and dried cut surfaces of bone with the knee brought to extension with a size 8 mm PS trial insert.      Once the cement had fully cured, excess cement was removed   throughout the knee.  I confirmed that I was satisfied with the range of   motion and stability, and the final size 8 mm PS AOX insert was chosen.  It was   placed into the knee.      The tourniquet had been let down at 26 minutes.  No significant   hemostasis was required.  The extensor mechanism was then reapproximated using #1 Vicryl  and #1 Stratafix sutures with the knee   in flexion.  The   remaining wound was closed  with 2-0 Vicryl and running 4-0 Monocryl.   The knee was cleaned, dried, dressed sterilely using Dermabond and   Aquacel dressing.  The patient was then   brought to recovery room in stable condition, tolerating the procedure   well.   Please note that Physician Assistant, Costella Hatcher, PA-C was present for the entirety of the case, and was utilized for pre-operative positioning, peri-operative retractor management, general facilitation of the procedure and for primary wound closure at the end of the case.              Pietro Cassis Alvan Dame, M.D.    10/20/2021 2:41 PM

## 2021-10-20 NOTE — Anesthesia Preprocedure Evaluation (Addendum)
Anesthesia Evaluation  Patient identified by MRN, date of birth, ID band Patient awake    Reviewed: Allergy & Precautions, NPO status , Patient's Chart, lab work & pertinent test results  Airway Mallampati: II  TM Distance: >3 FB Neck ROM: Full    Dental  (+) Teeth Intact, Dental Advisory Given   Pulmonary neg pulmonary ROS,    Pulmonary exam normal breath sounds clear to auscultation       Cardiovascular negative cardio ROS Normal cardiovascular exam Rhythm:Regular Rate:Normal     Neuro/Psych negative neurological ROS     GI/Hepatic Neg liver ROS, GERD  Medicated,  Endo/Other  Obesity   Renal/GU negative Renal ROS     Musculoskeletal  (+) Arthritis , Osteoarthritis,    Abdominal   Peds  Hematology negative hematology ROS (+) Plt 242k   Anesthesia Other Findings Day of surgery medications reviewed with the patient.  H/o Endometrial carcinoma, breast cancer  Reproductive/Obstetrics                            Anesthesia Physical Anesthesia Plan  ASA: 2  Anesthesia Plan: Spinal   Post-op Pain Management: Regional block* and Tylenol PO (pre-op)*   Induction: Intravenous  PONV Risk Score and Plan: 2 and Midazolam, Dexamethasone and Ondansetron  Airway Management Planned: Nasal Cannula and Natural Airway  Additional Equipment:   Intra-op Plan:   Post-operative Plan:   Informed Consent: I have reviewed the patients History and Physical, chart, labs and discussed the procedure including the risks, benefits and alternatives for the proposed anesthesia with the patient or authorized representative who has indicated his/her understanding and acceptance.     Dental advisory given  Plan Discussed with: CRNA, Anesthesiologist and Surgeon  Anesthesia Plan Comments: (Discussed risks and benefits of and differences between spinal and general. Discussed risks of spinal including  headache, backache, failure, bleeding, infection, and nerve damage. Patient consents to spinal. Questions answered. Coagulation studies and platelet count acceptable.)        Anesthesia Quick Evaluation

## 2021-10-20 NOTE — H&P (Signed)
TOTAL KNEE ADMISSION H&P  Patient is being admitted for right total knee arthroplasty.  Subjective:  Chief Complaint:right knee pain.  HPI: Monica Perry, 66 y.o. female, has a history of pain and functional disability in the right knee due to arthritis and has failed non-surgical conservative treatments for greater than 12 weeks to includeNSAID's and/or analgesics, corticosteriod injections, and viscosupplementation injections.  Onset of symptoms was gradual, starting 3 years ago with gradually worsening course since that time. The patient noted no past surgery on the right knee(s).  Patient currently rates pain in the right knee(s) at 8 out of 10 with activity. Patient has worsening of pain with activity and weight bearing, pain that interferes with activities of daily living, and pain with passive range of motion.  Patient has evidence of joint space narrowing by imaging studies.  There is no active infection.  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   GERD (gastroesophageal reflux disease)    Endometrial carcinoma ([place])    ASTHMATIC BRONCHITIS, ACUTE 10/24/2007   ALLERGIC RHINITIS 10/24/2007   Past Medical History:  Diagnosis Date   Allergy    Arthritis    knees   Breast cancer ([place]) 08/2020   right breast DCIS   Elevated cholesterol    Endometrial carcinoma ([place])    Family history of breast cancer 09/10/2020   Family history of melanoma 09/10/2020   Family history of ovarian cancer 09/10/2020   GERD (gastroesophageal reflux disease)    History of endometrial cancer 09/10/2020    Past Surgical History:  Procedure Laterality Date   ABDOMINAL HYSTERECTOMY     BREAST LUMPECTOMY WITH RADIOACTIVE SEED  LOCALIZATION Right 10/08/2020   Procedure: RIGHT BREAST LUMPECTOMY WITH RADIOACTIVE SEED LOCALIZATION;  Surgeon: Rolm Bookbinder, MD;  Location: [facility];  Service: General;  Laterality: Right;   CESAREAN SECTION      No current facility-administered medications for this encounter.   Current Outpatient Medications  Medication Sig Dispense Refill Last Dose   acetaminophen (TYLENOL) 500 MG tablet Take 500 mg by mouth every 6 (six) hours as needed (for pain.).      BIOTIN PO Take 1 capsule by mouth 3 (three) times a week.      calcium carbonate (TUMS - DOSED IN MG ELEMENTAL CALCIUM) 500 MG chewable tablet Chew 2 tablets by mouth daily after lunch.      cholecalciferol (VITAMIN D3) 25 MCG (1000 UNIT) tablet Take 1,000 Units by mouth 2 (two) times a week.      ibuprofen (ADVIL) 200 MG tablet Take 200 mg by mouth every 8 (eight) hours as needed (PAIN.).      Multiple Vitamins-Minerals (MULTIVITAMIN WITH MINERALS) tablet Take 1 tablet by mouth in the morning.      pravastatin (PRAVACHOL) 20 MG tablet Take 20 mg by mouth every evening.      tamoxifen (NOLVADEX) 20 MG tablet Take 1 tablet (20 mg total) by mouth daily. (Patient taking differently: Take 20 mg by mouth every evening.) 90 tablet 3    Allergies  Allergen Reactions   Advair Hfa [Fluticasone-Salmeterol] Swelling    Lips swell     Advil [Ibuprofen] Swelling    Lips swell-with coating ONLY  Aspirin Swelling    Just only the orange coating aspirin   Biaxin [Clarithromycin] Other (See Comments)    Lip swelling   Papaver Somniferum [Papaver] Hives    Poppy seeds-hives   Red Dye Other (See Comments)    Swelling.   Septra [Sulfamethoxazole-Trimethoprim] Itching and Other (See Comments)    Lip numbness/swelling   Stevia Glycerite Extract [Flavoring Agent] Other (See Comments)    Lip numbness   Sulfa Antibiotics Itching    Social History   Tobacco Use   Smoking status: Never   Smokeless tobacco: Never   Substance Use Topics   Alcohol use: No    Family History  Problem Relation Age of Onset   Lymphoma Brother        dx late 47s   Melanoma Brother        dx after 49   Ovarian cancer Maternal Aunt        dx 109s   Breast cancer Paternal Aunt        dx 66s   Uterine cancer Maternal Aunt        dx 67s     Review of Systems  Constitutional:  Negative for chills and fever.  Respiratory:  Negative for cough and shortness of breath.   Cardiovascular:  Negative for chest pain.  Gastrointestinal:  Negative for nausea and vomiting.  Musculoskeletal:  Positive for arthralgias.     Objective:  Physical Exam Well nourished and well developed. General: Alert and oriented x3, cooperative and pleasant, no acute distress. Head: normocephalic, atraumatic, neck supple. Eyes: EOMI.  Musculoskeletal: Bilateral knee exams: Noted bilateral genu varum Slight flexion contractures Flexion close to 120 degrees with terminal tightness and mild crepitation anteriorly Tenderness over the medial and anterior aspect the knees Stable medial and lateral collateral ligaments  Calves soft and nontender. Motor function intact in LE. Strength 5/5 LE bilaterally. Neuro: Distal pulses 2+. Sensation to light touch intact in LE.  Vital signs in last 24 hours:    Labs:   Estimated body mass index is 30.73 kg/m as calculated from the following:   Height as of 10/12/21: '5\' 4"'$  (1.626 m).   Weight as of 10/12/21: 81.2 kg.   Imaging Review Plain radiographs demonstrate severe degenerative joint disease of the right knee(s). The overall alignment isneutral. The bone quality appears to be adequate for age and reported activity level.      Assessment/Plan:  End stage arthritis, right knee   The patient history, physical examination, clinical judgment of the provider and imaging studies are consistent with end stage degenerative joint disease of the right knee(s) and total knee arthroplasty is deemed  medically necessary. The treatment options including medical management, injection therapy arthroscopy and arthroplasty were discussed at length. The risks and benefits of total knee arthroplasty were presented and reviewed. The risks due to aseptic loosening, infection, stiffness, patella tracking problems, thromboembolic complications and other imponderables were discussed. The patient acknowledged the explanation, agreed to proceed with the plan and consent was signed. Patient is being admitted for inpatient treatment for surgery, pain control, PT, OT, prophylactic antibiotics, VTE prophylaxis, progressive ambulation and ADL's and discharge planning. The patient is planning to be discharged  home.   Therapy Plans: outpatient therapy at [facility] Disposition: Home with husband Planned DVT Prophylaxis: aspirin '81mg'$  BID DME needed: walker PCP: Dr. Nancy Fetter, clearance received TXA: IV Allergies: advair - lips swelling, clarithromycin - rash, red dye - angioedema, septra - rash, stevia - angioedema, poppy seeds -  itchy Anesthesia Concerns: none BMI: 30.9 Last HgbA1c: Not diabetic   Other: - oxycodone, robaxin, tylenol, celebrex - Hx of breast CA s/p lumpectomy 1 year ago  Patient's anticipated LOS is less than 2 midnights, meeting these requirements: - Younger than 10 - Lives within 1 hour of care - Has a competent adult at home to recover with post-op recover - NO history of  - Chronic pain requiring opiods  - Diabetes  - Coronary Artery Disease  - Heart failure  - Heart attack  - Stroke  - DVT/VTE  - Cardiac arrhythmia  - Respiratory Failure/COPD  - Renal failure  - Anemia  - Advanced Liver disease  Costella Hatcher, PA-C Orthopedic Surgery [facility] [place] [phone]

## 2021-10-20 NOTE — Progress Notes (Signed)
AssistedDr. Gifford Shave with right, adductor canal block. Side rails up, monitors on throughout procedure. See vital signs in flow sheet. Tolerated Procedure well.

## 2021-10-20 NOTE — Anesthesia Procedure Notes (Signed)
Anesthesia Regional Block: Adductor canal block   Pre-Anesthetic Checklist: , timeout performed,  Correct Patient, Correct Site, Correct Laterality,  Correct Procedure, Correct Position, site marked,  Risks and benefits discussed,  Surgical consent,  Pre-op evaluation,  At surgeon's request and post-op pain management  Laterality: Right  Prep: chloraprep       Needles:  Injection technique: Single-shot  Needle Type: Echogenic Needle     Needle Length: 9cm  Needle Gauge: 21     Additional Needles:   Procedures:,,,, ultrasound used (permanent image in chart),,    Narrative:  Start time: 10/20/2021 12:20 PM End time: 10/20/2021 12:26 PM Injection made incrementally with aspirations every 5 mL.  Performed by: Personally  Anesthesiologist: Santa Lighter, MD  Additional Notes: No pain on injection. No increased resistance to injection. Injection made in 5cc increments.  Good needle visualization.  Patient tolerated procedure well.

## 2021-10-20 NOTE — Transfer of Care (Signed)
Immediate Anesthesia Transfer of Care Note  Patient: Monica Perry  Procedure(s) Performed: TOTAL KNEE ARTHROPLASTY (Right: Knee)  Patient Location: PACU  Anesthesia Type:Spinal  Level of Consciousness: awake  Airway & Oxygen Therapy: Patient Spontanous Breathing and Patient connected to face mask oxygen  Post-op Assessment: Report given to RN and Post -op Vital signs reviewed and stable  Post vital signs: Reviewed and stable  Last Vitals:  Vitals Value Taken Time  BP 121/68 10/20/21 1507  Temp    Pulse 82 10/20/21 1512  Resp 23 10/20/21 1512  SpO2 100 % 10/20/21 1512  Vitals shown include unvalidated device data.  Last Pain:  Vitals:   10/20/21 1058  TempSrc:   PainSc: 0-No pain      Patients Stated Pain Goal: 5 (37/94/32 7614)  Complications: No notable events documented.

## 2021-10-20 NOTE — Progress Notes (Signed)
Patient admitted to [id number] in NAD. VSS. AOx4. Right knee immobilizer, Ted hose, and SCDs in place. Patient oriented to unit and call bell. Family at bedside. All needs within reach. Falls precautions in place.

## 2021-10-20 NOTE — Anesthesia Postprocedure Evaluation (Signed)
Anesthesia Post Note  Patient: Monica Perry  Procedure(s) Performed: TOTAL KNEE ARTHROPLASTY (Right: Knee)     Patient location during evaluation: PACU Anesthesia Type: Spinal Level of consciousness: awake, awake and alert and oriented Pain management: pain level controlled Vital Signs Assessment: post-procedure vital signs reviewed and stable Respiratory status: spontaneous breathing, nonlabored ventilation and respiratory function stable Cardiovascular status: blood pressure returned to baseline and stable Postop Assessment: no headache, no backache, spinal receding and no apparent nausea or vomiting Anesthetic complications: no   No notable events documented.  Last Vitals:  Vitals:   10/20/21 1615 10/20/21 1640  BP: 101/89 125/72  Pulse: 90 86  Resp: 19 18  Temp: 36.4 C 36.7 C  SpO2: 100% 100%    Last Pain:  Vitals:   10/20/21 1640  TempSrc:   PainSc: 0-No pain                 Santa Lighter

## 2021-10-20 NOTE — Interval H&P Note (Signed)
History and Physical Interval Note:  10/20/2021 10:32 AM  Monica Perry  has presented today for surgery, with the diagnosis of Right knee osteoarthritis.  The various methods of treatment have been discussed with the patient and family. After consideration of risks, benefits and other options for treatment, the patient has consented to  Procedure(s): TOTAL KNEE ARTHROPLASTY (Right) as a surgical intervention.  The patient's history has been reviewed, patient examined, no change in status, stable for surgery.  I have reviewed the patient's chart and labs.  Questions were answered to the patient's satisfaction.     Mauri Pole

## 2021-10-21 ENCOUNTER — Encounter (HOSPITAL_COMMUNITY): Payer: Self-pay | Admitting: Orthopedic Surgery

## 2021-10-21 DIAGNOSIS — M1711 Unilateral primary osteoarthritis, right knee: Secondary | ICD-10-CM | POA: Diagnosis not present

## 2021-10-21 DIAGNOSIS — Z853 Personal history of malignant neoplasm of breast: Secondary | ICD-10-CM | POA: Diagnosis not present

## 2021-10-21 DIAGNOSIS — Z79899 Other long term (current) drug therapy: Secondary | ICD-10-CM | POA: Diagnosis not present

## 2021-10-21 DIAGNOSIS — Z8542 Personal history of malignant neoplasm of other parts of uterus: Secondary | ICD-10-CM | POA: Diagnosis not present

## 2021-10-21 LAB — BASIC METABOLIC PANEL
Anion gap: 6 (ref 5–15)
BUN: 14 mg/dL (ref 8–23)
CO2: 24 mmol/L (ref 22–32)
Calcium: 8.3 mg/dL — ABNORMAL LOW (ref 8.9–10.3)
Chloride: 106 mmol/L (ref 98–111)
Creatinine, Ser: 0.73 mg/dL (ref 0.44–1.00)
GFR, Estimated: >60 mL/min (ref >60)
Glucose, Bld: 135 mg/dL — ABNORMAL HIGH (ref 70–99)
Potassium: 4 mmol/L (ref 3.5–5.1)
Sodium: 136 mmol/L (ref 135–145)

## 2021-10-21 LAB — CBC
HCT: 33.5 % — ABNORMAL LOW (ref 36.0–46.0)
Hemoglobin: 10.8 g/dL — ABNORMAL LOW (ref 12.0–15.0)
MCH: 29.8 pg (ref 26.0–34.0)
MCHC: 32.2 g/dL (ref 30.0–36.0)
MCV: 92.3 fL (ref 80.0–100.0)
Platelets: 235 10*3/uL (ref 150–400)
RBC: 3.63 MIL/uL — ABNORMAL LOW (ref 3.87–5.11)
RDW: 13.1 % (ref 11.5–15.5)
WBC: 12.5 10*3/uL — ABNORMAL HIGH (ref 4.0–10.5)
nRBC: 0 % (ref 0.0–0.2)

## 2021-10-21 MED ORDER — METHOCARBAMOL 500 MG PO TABS: 500.0000 mg | ORAL_TABLET | Freq: Four times a day (QID) | ORAL | 0 refills | Status: DC | PRN

## 2021-10-21 MED ORDER — POLYETHYLENE GLYCOL 3350 17 G PO PACK: 17.0000 g | PACK | Freq: Every day | ORAL | 0 refills | Status: DC | PRN

## 2021-10-21 MED ORDER — ASPIRIN 81 MG PO CHEW
81.0000 mg | CHEWABLE_TABLET | Freq: Two times a day (BID) | ORAL | 0 refills | Status: AC
Start: 2021-10-21 — End: 2021-11-18

## 2021-10-21 MED ORDER — OXYCODONE HCL 5 MG PO TABS: 5.0000 mg | ORAL_TABLET | ORAL | 0 refills | Status: DC | PRN

## 2021-10-21 NOTE — Progress Notes (Signed)
10/21/21 1600  PT Visit Information  Last PT Received On 10/21/21  Pt meeting goals. Ready for d/c with family assist as needed from Pt standpoint.  Husband present for stair training and mobility as noted below.   Assistance Needed +1  History of Present Illness 66 yo female s/p R TKA on 10/20/21. PMH: R breast CA s/p lumpectomy  Subjective Data  Patient Stated Goal to go home asap, have less pain  Precautions  Precautions Knee;Fall  Precaution Comments PT  requested KI d/t significant quad lag  Required Braces or Orthoses Knee Immobilizer - Right  Restrictions  Weight Bearing Restrictions No  Other Position/Activity Restrictions WBAT  Pain Assessment  Pain Assessment 0-10  Pain Score 3  Pain Location right knee  Pain Descriptors / Indicators Sore  Pain Intervention(s) Monitored during session;Premedicated before session;Limited activity within patient's tolerance;Repositioned  Cognition  Arousal/Alertness Awake/alert  Behavior During Therapy WFL for tasks assessed/performed  Overall Cognitive Status Within Functional Limits for tasks assessed  Bed Mobility  Overal bed mobility Needs Assistance  Bed Mobility Supine to Sit  Supine to sit Supervision;Modified independent (Device/Increase time)  Transfers  Overall transfer level Needs assistance  Equipment used Rolling walker (2 wheels)  Transfers Sit to/from Stand  Sit to Stand Supervision  General transfer comment verbal cues for hand placement and RLE position  Ambulation/Gait  Ambulation/Gait assistance Min guard  Gait Distance (Feet) 40 Feet (15' x2 and 5' fwd and back)  Assistive device Rolling walker (2 wheels)  Gait Pattern/deviations Step-to pattern  General Gait Details improved quad activation this pm, step to pattern for safety with KI in place. no buckling noted. cues for RW position and sequence; pt able to take steps fwd and back without KI, no buckling  Stairs Yes  Stairs assistance Min guard  Stair  Management One rail Left;Step to pattern;Forwards;With cane  Number of Stairs 2  General stair comments cues for sequence, and technique, min/guard for safety. no buckling however advised to use KI for safety  Balance  Sitting balance-Leahy Scale Good  Standing balance support Single extremity supported;No upper extremity supported  Standing balance-Leahy Scale Fair  PT - End of Session  Equipment Utilized During Treatment Gait belt;Right knee immobilizer  Activity Tolerance Patient tolerated treatment well  Patient left with call bell/phone within reach;in bed;with family/visitor present  Nurse Communication Mobility status   PT - Assessment/Plan  PT Plan Current plan remains appropriate  PT Visit Diagnosis Other abnormalities of gait and mobility (R26.89);Difficulty in walking, not elsewhere classified (R26.2)  PT Frequency (ACUTE ONLY) 7X/week  Follow Up Recommendations Follow physician's recommendations for discharge plan and follow up therapies  Assistance recommended at discharge Intermittent Supervision/Assistance  Patient can return home with the following A little help with walking and/or transfers;Help with stairs or ramp for entrance;Assist for transportation;Assistance with cooking/housework  PT equipment None recommended by PT  AM-PAC PT "6 Clicks" Mobility Outcome Measure (Version 2)  Help needed turning from your back to your side while in a flat bed without using bedrails? 4  Help needed moving from lying on your back to sitting on the side of a flat bed without using bedrails? 4  Help needed moving to and from a bed to a chair (including a wheelchair)? 3  Help needed standing up from a chair using your arms (e.g., wheelchair or bedside chair)? 3  Help needed to walk in hospital room? 3  Help needed climbing 3-5 steps with a railing?  3  6  Click Score 20  Consider Recommendation of Discharge To: Home with no services  Progressive Mobility  What is the highest level of  mobility based on the progressive mobility assessment? Level 4 (Walks with assist in room) - Balance while marching in place and cannot step forward and back - Complete  Activity Ambulated with assistance in hallway  PT Goal Progression  Progress towards PT goals Progressing toward goals  Acute Rehab PT Goals  PT Goal Formulation With patient  Time For Goal Achievement 10/28/21  Potential to Achieve Goals Good  PT Time Calculation  PT Start Time (ACUTE ONLY) 1613  PT Stop Time (ACUTE ONLY) 1635  PT Time Calculation (min) (ACUTE ONLY) 22 min  PT General Charges  $$ ACUTE PT VISIT 1 Visit  PT Treatments  $Gait Training 8-22 mins

## 2021-10-21 NOTE — Progress Notes (Signed)
Orthopedic Tech Progress Note Patient Details:  Monica Perry 04-10-1956 [id number]  Patient ID: Monica Perry, female   DOB: January 22, 1956, 66 y.o.   MRN: [id number]  Kennis Carina 10/21/2021, 11:42 AM Knee immoblizer brought to room per PT

## 2021-10-21 NOTE — Evaluation (Signed)
Physical Therapy Evaluation Patient Details Name: Monica Perry MRN: [id number] DOB: [birth year] Today's Date: 10/21/2021  History of Present Illness  66 yo female s/p R TKA on 10/20/21. PMH: R breast CA s/p lumpectomy  Clinical Impression  Pt is s/p TKA resulting in the deficits listed below (see PT Problem List).  Pt motivated however limited by significant quad lag/decr quad activation RLE, likely d/t residual effects of regional anesthesia. Discussed with RN. Requested KI. Will see again later today  Pt will benefit from skilled PT to increase their independence and safety with mobility to allow discharge to the venue listed below.         Recommendations for follow up therapy are one component of a multi-disciplinary discharge planning process, led by the attending physician.  Recommendations may be updated based on patient status, additional functional criteria and insurance authorization.  Follow Up Recommendations Follow physician's recommendations for discharge plan and follow up therapies      Assistance Recommended at Discharge Intermittent Supervision/Assistance  Patient can return home with the following  A little help with walking and/or transfers;Help with stairs or ramp for entrance;Assist for transportation;Assistance with cooking/housework    Equipment Recommendations None recommended by PT  Recommendations for Other Services       Functional Status Assessment Patient has had a recent decline in their functional status and demonstrates the ability to make significant improvements in function in a reasonable and predictable amount of time.     Precautions / Restrictions Precautions Precautions: Knee;Fall Precaution Comments: PT  requested KI d/t significant quad lag Required Braces or Orthoses: Knee Immobilizer - Right Restrictions Weight Bearing Restrictions: No Other Position/Activity Restrictions: WBAT      Mobility  Bed Mobility                General bed mobility comments: in recliner    Transfers Overall transfer level: Needs assistance Equipment used: Rolling walker (2 wheels) Transfers: Sit to/from Stand Sit to Stand: Min assist           General transfer comment: assist to rise and transition to RW. cues for hand placement    Ambulation/Gait               General Gait Details: unable d/t knee buckling  Stairs            Wheelchair Mobility    Modified Rankin (Stroke Patients Only)       Balance Overall balance assessment: Needs assistance Sitting-balance support: No upper extremity supported, Feet supported Sitting balance-Leahy Scale: Good     Standing balance support: Reliant on assistive device for balance, Bilateral upper extremity supported Standing balance-Leahy Scale: Poor                               Pertinent Vitals/Pain Pain Assessment Pain Assessment: 0-10 Pain Score: 4  Pain Location: right knee Pain Descriptors / Indicators: Sore Pain Intervention(s): Premedicated before session, Monitored during session, Limited activity within patient's tolerance, Repositioned    Home Living Family/patient expects to be discharged to:: Private residence Living Arrangements: Spouse/significant other Available Help at Discharge: Family Type of Home: House Home Access: Stairs to enter   Technical brewer of Steps: 2   Home Layout: One level Home Equipment: Conservation officer, nature (2 wheels)      Prior Function Prior Level of Function : Independent/Modified Independent  Hand Dominance        Extremity/Trunk Assessment   Upper Extremity Assessment Upper Extremity Assessment: Overall WFL for tasks assessed    Lower Extremity Assessment Lower Extremity Assessment: RLE deficits/detail RLE Deficits / Details: ankle WFL (pt reported numbness in ankle) knee extension 2/5, extensive quad lag ~ 50 degrees. likely d/t regional anesthesia        Communication   Communication: No difficulties  Cognition Arousal/Alertness: Awake/alert Behavior During Therapy: WFL for tasks assessed/performed Overall Cognitive Status: Within Functional Limits for tasks assessed                                          General Comments      Exercises Total Joint Exercises Ankle Circles/Pumps: AROM, Both, 10 reps Quad Sets: AROM, Both, 10 reps Heel Slides: AAROM, Right, 10 reps Straight Leg Raises: AAROM, Right, 5 reps   Assessment/Plan    PT Assessment Patient needs continued PT services  PT Problem List Decreased strength;Decreased range of motion;Decreased activity tolerance;Pain;Decreased knowledge of use of DME;Decreased mobility;Decreased balance       PT Treatment Interventions DME instruction;Therapeutic exercise;Gait training;Stair training;Functional mobility training;Therapeutic activities;Patient/family education    PT Goals (Current goals can be found in the Care Plan section)  Acute Rehab PT Goals Patient Stated Goal: to go home asap, have less pain PT Goal Formulation: With patient Time For Goal Achievement: 10/28/21 Potential to Achieve Goals: Good    Frequency 7X/week     Co-evaluation               AM-PAC PT "6 Clicks" Mobility  Outcome Measure Help needed turning from your back to your side while in a flat bed without using bedrails?: A Little   Help needed moving to and from a bed to a chair (including a wheelchair)?: A Lot Help needed standing up from a chair using your arms (e.g., wheelchair or bedside chair)?: A Little Help needed to walk in hospital room?: Total Help needed climbing 3-5 steps with a railing? : Total 6 Click Score: 10    End of Session Equipment Utilized During Treatment: Gait belt Activity Tolerance: Other (comment) (limited by knee buckling/quad lag/effects of regional block) Patient left: in chair;with call bell/phone within reach;with family/visitor  present Nurse Communication: Mobility status PT Visit Diagnosis: Other abnormalities of gait and mobility (R26.89);Difficulty in walking, not elsewhere classified (R26.2)    Time: 7322-0254 PT Time Calculation (min) (ACUTE ONLY): 16 min   Charges:   PT Evaluation $PT Eval Low Complexity: [place], PT  Acute Rehab Dept [facility]) [phone]  [facility] Weekend Pager [facility] only)  [phone]  10/21/2021   [facility] 10/21/2021, 11:44 AM

## 2021-10-21 NOTE — TOC Transition Note (Signed)
Transition of Care [facility]) - CM/SW Discharge Note  Patient Details  Name: Monica Perry MRN: [id number] Date of Birth: 02-06-1956  [facility] of [facility]) CM/SW Contact:  Sherie Don, LCSW Phone Number: 10/21/2021, 10:18 AM  Clinical Narrative: Patient is expected to discharge home after working with PT. CSW met with patient to review discharge plan and needs. Patient will go home with OPPT at [facility]. Patient needs a rolling walker, which was delivered to patient's room by [employer]. TOC signing off.  Final next level of care: OP Rehab Barriers to Discharge: No Barriers Identified  Patient Goals and CMS Choice Patient states their goals for this hospitalization and ongoing recovery are:: Discharge home with OPPT at [facility] Medicare.gov Compare Post Acute Care list provided to:: Patient Choice offered to / list presented to : Patient  Discharge Plan and Services         DME Arranged: Walker rolling DME Agency: [employer] Representative spoke with at DME Agency: Prearranged in orthopedist's office HH Arranged: NA [place] Agency: NA  Readmission Risk Interventions     No data to display

## 2021-10-21 NOTE — Progress Notes (Signed)
   Subjective: 1 Day Post-Op Procedure(s) (LRB): TOTAL KNEE ARTHROPLASTY (Right) Patient reports pain as mild.   Patient seen in rounds with Dr. Alvan Dame. Patient is well, and has had no acute complaints or problems. She had an episode of throat tightness/difficulty breathing yesterday which lasted only a brief period but was very scary to her. Her RN is in the room and tells [place] that the prior shift RN described this as a possible laryngospasm. Foley catheter removed.  We will start therapy today.   Objective: Vital signs in last 24 hours: Temp:  [97.6 F (36.4 C)-99.2 F (37.3 C)] 97.9 F (36.6 C) (06/21 0509) Pulse Rate:  [61-97] 69 (06/21 0509) Resp:  [14-21] 17 (06/21 0509) BP: (96-170)/(58-91) 97/58 (06/21 0509) SpO2:  [96 %-100 %] 99 % (06/21 0509) Weight:  [81.2 kg] 81.2 kg (06/20 1058)  Intake/Output from previous day:  Intake/Output Summary (Last 24 hours) at 10/21/2021 0813 Last data filed at 10/21/2021 0600 Gross per 24 hour  Intake 3201.57 ml  Output 2300 ml  Net 901.57 ml     Intake/Output this shift: No intake/output data recorded.  Labs: Recent Labs    10/21/21 0338  HGB 10.8*   Recent Labs    10/21/21 0338  WBC 12.5*  RBC 3.63*  HCT 33.5*  PLT 235   Recent Labs    10/21/21 0338  NA 136  K 4.0  CL 106  CO2 24  BUN 14  CREATININE 0.73  GLUCOSE 135*  CALCIUM 8.3*   No results for input(s): "LABPT", "INR" in the last 72 hours.  Exam: General - Patient is Alert and Oriented Extremity - Neurologically intact Sensation intact distally Intact pulses distally Dorsiflexion/Plantar flexion intact Dressing - dressing C/D/I Motor Function - intact, moving foot and toes well on exam.   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   Weight as of this encounter: 81.2 kg. Advance diet Up with therapy D/C IV fluids   DVT Prophylaxis - Aspirin Weight bearing as tolerated.  Hgb stable at 10.8 this AM.  Dr. Alvan Dame discussed with her that her episode of throat tightness/difficulty breathing is unlikely to happen again, but we will address if it does.   Plan is to go Home after hospital stay. Plan for discharge today following 1-2 sessions of PT as long as they are meeting their goals. Patient is scheduled for OPPT next Tuesday. Follow up in the office in 2 weeks.   Griffith Citron, PA-C Orthopedic Surgery [phone] 10/21/2021, 8:13 AM

## 2021-10-21 NOTE — Care Plan (Signed)
Ortho Bundle Case Management Note  Patient Details  Name: Monica Perry MRN: [id number] Date of Birth: 1955/05/05  R TKA on 10-20-21 DCP:  Home with husband DME:  RW ordered through [facility] PT:  Meriel Pica on 10-27-21                   DME Arranged:  Gilford Rile rolling DME Agency:  [employer]  HH Arranged:  NA [place] Agency:  NA  Additional Comments: Please contact me with any questions of if this plan should need to change.  Marianne Sofia, RN,CCM [facility]  [phone] 10/21/2021, 9:50 AM

## 2021-10-21 NOTE — Plan of Care (Signed)
  Problem: Education: Goal: Knowledge of General Education information will improve Description: Including pain rating scale, medication(s)/side effects and non-pharmacologic comfort measures 10/21/2021 0747 by Olen Cordial, RN Outcome: Progressing 10/21/2021 0747 by Olen Cordial, RN Outcome: Progressing   Problem: Activity: Goal: Risk for activity intolerance will decrease 10/21/2021 0747 by Olen Cordial, RN Outcome: Progressing 10/21/2021 0747 by Olen Cordial, RN Outcome: Progressing   Problem: Pain Managment: Goal: General experience of comfort will improve 10/21/2021 0747 by Olen Cordial, RN Outcome: Progressing 10/21/2021 0747 by Olen Cordial, RN Outcome: Progressing

## 2021-10-21 NOTE — Progress Notes (Signed)
Physical Therapy Treatment Patient Details Name: Monica Perry MRN: [id number] DOB: March 21, 1956 Today's Date: 10/21/2021   History of Present Illness 66 yo female s/p R TKA on 10/20/21. PMH: R breast CA s/p lumpectomy    PT Comments    Pt progressing this pm, demonstrating much improved quad control. Amb hallway distance, pt remains motivated to d/c home today. Will see once more for stair training. Pt should be able to d/c after stair practice.   Recommendations for follow up therapy are one component of a multi-disciplinary discharge planning process, led by the attending physician.  Recommendations may be updated based on patient status, additional functional criteria and insurance authorization.  Follow Up Recommendations  Follow physician's recommendations for discharge plan and follow up therapies     Assistance Recommended at Discharge Intermittent Supervision/Assistance  Patient can return home with the following A little help with walking and/or transfers;Help with stairs or ramp for entrance;Assist for transportation;Assistance with cooking/housework   Equipment Recommendations  None recommended by PT    Recommendations for Other Services       Precautions / Restrictions Precautions Precautions: Knee;Fall Precaution Comments: PT  requested KI d/t significant quad lag Required Braces or Orthoses: Knee Immobilizer - Right Restrictions Weight Bearing Restrictions: No Other Position/Activity Restrictions: WBAT     Mobility  Bed Mobility Overal bed mobility: Needs Assistance Bed Mobility: Sit to Supine       Sit to supine: Min assist   General bed mobility comments: assist with RLE    Transfers Overall transfer level: Needs assistance Equipment used: Rolling walker (2 wheels) Transfers: Sit to/from Stand Sit to Stand: Min guard, Supervision           General transfer comment: verbal cues for hand placement and RLE position     Ambulation/Gait Ambulation/Gait assistance: Min guard Gait Distance (Feet): 60 Feet Assistive device: Rolling walker (2 wheels) Gait Pattern/deviations: Step-to pattern       General Gait Details: improved quad activation this pm, step to pattern for safety with KI in place. no buckling noted. cues for RW position and sequence   Stairs             Wheelchair Mobility    Modified Rankin (Stroke Patients Only)       Balance Overall balance assessment: Needs assistance Sitting-balance support: No upper extremity supported, Feet supported Sitting balance-Leahy Scale: Good     Standing balance support: Single extremity supported, No upper extremity supported Standing balance-Leahy Scale: Fair                              Cognition Arousal/Alertness: Awake/alert Behavior During Therapy: WFL for tasks assessed/performed Overall Cognitive Status: Within Functional Limits for tasks assessed                                          Exercises   General Comments        Pertinent Vitals/Pain Pain Assessment Pain Assessment: 0-10 Pain Score: 3  Pain Location: right knee Pain Descriptors / Indicators: Sore Pain Intervention(s): Limited activity within patient's tolerance, Monitored during session, Premedicated before session, Repositioned    Home Living Family/patient expects to be discharged to:: Private residence Living Arrangements: Spouse/significant other Available Help at Discharge: Family Type of Home: House Home Access: Stairs to enter   [employer] of Steps: 2  Home Layout: One level        Prior Function            PT Goals (current goals can now be found in the care plan section) Acute Rehab PT Goals Patient Stated Goal: to go home asap, have less pain PT Goal Formulation: With patient Time For Goal Achievement: 10/28/21 Potential to Achieve Goals: Good Progress towards PT goals: Progressing toward  goals    Frequency    7X/week      PT Plan Current plan remains appropriate    Co-evaluation              AM-PAC PT "6 Clicks" Mobility   Outcome Measure  Help needed turning from your back to your side while in a flat bed without using bedrails?: A Little Help needed moving from lying on your back to sitting on the side of a flat bed without using bedrails?: A Little Help needed moving to and from a bed to a chair (including a wheelchair)?: A Little Help needed standing up from a chair using your arms (e.g., wheelchair or bedside chair)?: A Little Help needed to walk in hospital room?: A Little Help needed climbing 3-5 steps with a railing? : A Little 6 Click Score: 18    End of Session Equipment Utilized During Treatment: Gait belt;Right knee immobilizer Activity Tolerance: Patient tolerated treatment well Patient left: in bed;with call bell/phone within reach;with bed alarm set Nurse Communication: Mobility status PT Visit Diagnosis: Other abnormalities of gait and mobility (R26.89);Difficulty in walking, not elsewhere classified (R26.2)     Time: 7357-8978 PT Time Calculation (min) (ACUTE ONLY): 28 min  Charges:  $Gait Training: 23-37 mins                     Baxter Flattery, PT  Acute Rehab Dept [facility]) [phone]  [facility] Weekend Pager [facility] only)  [phone]  10/21/2021    [facility] 10/21/2021, 3:24 PM

## 2021-10-27 DIAGNOSIS — M25561 Pain in right knee: Secondary | ICD-10-CM | POA: Diagnosis not present

## 2021-10-27 DIAGNOSIS — R2689 Other abnormalities of gait and mobility: Secondary | ICD-10-CM | POA: Diagnosis not present

## 2021-10-30 DIAGNOSIS — M25561 Pain in right knee: Secondary | ICD-10-CM | POA: Diagnosis not present

## 2021-10-30 DIAGNOSIS — R2689 Other abnormalities of gait and mobility: Secondary | ICD-10-CM | POA: Diagnosis not present

## 2021-11-02 DIAGNOSIS — M25561 Pain in right knee: Secondary | ICD-10-CM | POA: Diagnosis not present

## 2021-11-02 DIAGNOSIS — R2689 Other abnormalities of gait and mobility: Secondary | ICD-10-CM | POA: Diagnosis not present

## 2021-11-02 NOTE — Progress Notes (Signed)
Patient Care Team: Donald Prose, MD as PCP - General (Family Medicine) Rolm Bookbinder, MD as Consulting Physician (General Surgery) Nicholas Lose, MD as Consulting Physician (Hematology and Oncology) Gery Pray, MD as Consulting Physician (Radiation Oncology)  DIAGNOSIS:  Encounter Diagnosis  Name Primary?   Ductal carcinoma in situ (DCIS) of right breast     SUMMARY OF ONCOLOGIC HISTORY: 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%   09/10/2020 Cancer Staging   Staging form: Breast, AJCC 8th Edition - Clinical stage from 09/10/2020: Stage 0 (cTis (DCIS), cN0, cM0, ER+, PR+, HER2: Not Assessed) - Signed by Nicholas Lose, MD on 09/10/2020 Stage prefix: Initial diagnosis Nuclear grade: G3 Laterality: Right Staged by: Pathologist and managing physician Stage used in treatment planning: Yes National guidelines used in treatment planning: Yes Type of national guideline used in treatment planning: NCCN   09/16/2020 Genetic Testing   Negative hereditary cancer genetic testing: no pathogenic variants detected in [place] Panel or [facility] CancerNext-Expanded +RNAinsight Panel.  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 The report dates are Sep 16, 2020 and Sep 30, 2020.    The BRCAplus panel offered by [employer] and includes sequencing and deletion/duplication analysis for the following 8 genes: ATM, BRCA1, BRCA2, CDH1, CHEK2, PALB2, PTEN, and TP53.  The CancerNext-Expanded gene panel offered by [facility] and includes sequencing, rearrangement, and RNA analysis for the following 77 genes: AIP, ALK, APC, ATM, AXIN2, BAP1, BARD1, BLM, BMPR1A, BRCA1, BRCA2, BRIP1, CDC73, CDH1, CDK4, CDKN1B, CDKN2A, CHEK2, CTNNA1, DICER1, FANCC, FH, FLCN,  GALNT12, KIF1B, LZTR1, MAX, MEN1, MET, MLH1, MSH2, MSH3, MSH6, MUTYH, NBN, NF1, NF2, NTHL1, PALB2, PHOX2B, PMS2, POT1, PRKAR1A, PTCH1, PTEN, RAD51C, RAD51D, RB1, RECQL, RET, SDHA, SDHAF2, SDHB, SDHC, SDHD, SMAD4, SMARCA4, SMARCB1, SMARCE1, STK11, SUFU, TMEM127, TP53, TSC1, TSC2, VHL and XRCC2 (sequencing and deletion/duplication); EGFR, EGLN1, HOXB13, KIT, MITF, PDGFRA, POLD1, and POLE (sequencing only); EPCAM and GREM1 (deletion/duplication only).   The variant of uncertain significance (VUS) in PTCH1 at p.R1206C has been reclassified to likely benign.  The change in variant classification was made as a result of re-review of evidence in light of new variant interpretation guidelines and/or new information. The amended report date is April 17, 2021.    10/08/2020 Surgery   Right lumpectomy [name]): high grade DCIS with necrosis, clear margins.    10/08/2020 Cancer Staging   Staging form: Breast, AJCC 8th Edition - Pathologic stage from 10/08/2020: Stage 0 (pTis (DCIS), pN0, cM0) - Signed by Gardenia Phlegm, NP on 03/11/2021 Stage prefix: Initial diagnosis   11/18/2020 - 12/10/2020 Radiation Therapy   11/18/2020 through 12/10/2020 Site Technique Total Dose (Gy) Dose per Fx (Gy) Completed Fx Beam Energies  Breast, Right: Breast_Rt 3D 42.72/42.72 2.67 16/16 6X, 10X      01/2021 -  Anti-estrogen oral therapy   Tamoxifen daily x 5 years     CHIEF COMPLIANT: Follow-up of right breast cancer on tamoxifen  INTERVAL HISTORY: Monica Perry is a 66 y.o. with above-mentioned history of DCIS of the right breast. She presents to the clinic today for follow-up. She is tolerating the tamoxifen. States that she has some mild hot flashes. Denies muscle cramps she just had a right knee replacement. She has some swelling in the right leg. She does elevate it after been on  it for a while. She denies mood swings and irritability.   ALLERGIES:  is allergic to advair hfa [fluticasone-salmeterol], advil  [ibuprofen], aspirin, biaxin [clarithromycin], papaver somniferum [papaver], red dye, septra [sulfamethoxazole-trimethoprim], stevia glycerite extract [flavoring agent], stevioside, and sulfa antibiotics.  MEDICATIONS:  Current Outpatient Medications  Medication Sig Dispense Refill   acetaminophen (TYLENOL) 500 MG tablet Take 500 mg by mouth every 6 (six) hours as needed (for pain.).     aspirin 81 MG chewable tablet Chew 1 tablet (81 mg total) by mouth 2 (two) times daily for 28 days. 56 tablet 0   BIOTIN PO Take 1 capsule by mouth 3 (three) times a week.     calcium carbonate (TUMS - DOSED IN MG ELEMENTAL CALCIUM) 500 MG chewable tablet Chew 2 tablets by mouth daily after lunch.     cholecalciferol (VITAMIN D3) 25 MCG (1000 UNIT) tablet Take 1,000 Units by mouth 2 (two) times a week.     Multiple Vitamins-Minerals (MULTIVITAMIN WITH MINERALS) tablet Take 1 tablet by mouth in the morning.     polyethylene glycol (MIRALAX / GLYCOLAX) 17 g packet Take 17 g by mouth daily as needed for mild constipation. 14 each 0   pravastatin (PRAVACHOL) 20 MG tablet Take 20 mg by mouth every evening.     tamoxifen (NOLVADEX) 20 MG tablet Take 1 tablet (20 mg total) by mouth every evening. 90 tablet 3   No current facility-administered medications for this visit.    PHYSICAL EXAMINATION: ECOG PERFORMANCE STATUS: 1 - Symptomatic but completely ambulatory  Vitals:   11/16/21 0945  BP: (!) 162/89  Pulse: 98  Resp: 18  Temp: 97.7 F (36.5 C)  SpO2: 99%   Filed Weights   11/16/21 0945  Weight: 182 lb 4.8 oz (82.7 kg)    BREAST: No palpable masses or nodules in either right or left breasts. No palpable axillary supraclavicular or infraclavicular adenopathy no breast tenderness or nipple discharge. (exam performed in the presence of a chaperone)  LABORATORY DATA:  I have reviewed the data as listed    Latest Ref Rng & Units 10/21/2021    3:38 AM 09/10/2020    8:22 AM 01/03/2015    9:12 AM  CMP   Glucose 70 - 99 mg/dL 135  102  108   BUN 8 - 23 mg/dL $Remove'14  12  17   'mYMyrWJ$ Creatinine 0.44 - 1.00 mg/dL 0.73  0.73  0.70   Sodium 135 - 145 mmol/L 136  142  135   Potassium 3.5 - 5.1 mmol/L 4.0  3.9  4.3   Chloride 98 - 111 mmol/L 106  105  103   CO2 22 - 32 mmol/L 24  26    Calcium 8.9 - 10.3 mg/dL 8.3  10.4    Total Protein 6.5 - 8.1 g/dL  7.2    Total Bilirubin 0.3 - 1.2 mg/dL  0.6    Alkaline Phos 38 - 126 U/L  79    AST 15 - 41 U/L  19    ALT 0 - 44 U/L  16      Lab Results  Component Value Date   WBC 12.5 (H) 10/21/2021   HGB 10.8 (L) 10/21/2021   HCT 33.5 (L) 10/21/2021   MCV 92.3 10/21/2021   PLT 235 10/21/2021   NEUTROABS 5.5 09/10/2020    ASSESSMENT & PLAN:  Ductal carcinoma in situ (DCIS) of right breast 09/05/2020:Screening detected left breast calcifications 0.4 cm span, stereotactic biopsy revealed high-grade DCIS with calcifications  and necrosis ER 80%, PR 5%   Recommendation: 1.  10/08/2020:Right lumpectomy [name]): high grade DCIS with necrosis, clear margins.  2. Followed by adjuvant radiation therapy completed 12/10/2020 3. Followed by antiestrogen therapy with tamoxifen 5 years started 01/01/2021  Tamoxifen toxicities: Mild hot flashes that were there even prior to starting tamoxifen  Recent right knee replacement surgery: There is mild swelling of the leg which is expected postoperatively.  She does not have any calf pain or tenderness.  If the swelling does not get better in a week we will obtain ultrasound of the legs to check.  She has an appointment with her orthopedic surgeon next week as well.  Breast cancer surveillance: 1.  Breast exam 11/16/2021: Benign 2. mammogram 08/21/2021: Benign breast density category B  Return to clinic in 1 year for follow-up    No orders of the defined types were placed in this encounter.  The patient has a good understanding of the overall plan. she agrees with it. she will call with any problems that may develop before  the next visit here. Total time spent: 30 mins including face to face time and time spent for planning, charting and co-ordination of care   Harriette Ohara, MD 11/16/21    I Gardiner Coins am scribing for Dr. Lindi Adie  I have reviewed the above documentation for accuracy and completeness, and I agree with the above.

## 2021-11-04 DIAGNOSIS — H35342 Macular cyst, hole, or pseudohole, left eye: Secondary | ICD-10-CM | POA: Diagnosis not present

## 2021-11-04 DIAGNOSIS — H04123 Dry eye syndrome of bilateral lacrimal glands: Secondary | ICD-10-CM | POA: Diagnosis not present

## 2021-11-04 DIAGNOSIS — H25813 Combined forms of age-related cataract, bilateral: Secondary | ICD-10-CM | POA: Diagnosis not present

## 2021-11-04 DIAGNOSIS — H35373 Puckering of macula, bilateral: Secondary | ICD-10-CM | POA: Diagnosis not present

## 2021-11-04 NOTE — Discharge Summary (Signed)
Patient ID: Monica Perry MRN: [id number] DOB/AGE: [birth year] 66 y.o.  Admit date: 10/20/2021 Discharge date: 10/21/2021  Admission Diagnoses:  Right knee osteoarthritis  Discharge Diagnoses:  Principal Problem:   S/P total knee arthroplasty, right   Past Medical History:  Diagnosis Date   Allergy    Arthritis    knees   Breast cancer ([place]) 08/2020   right breast DCIS   Elevated cholesterol    Endometrial carcinoma ([place])    Family history of breast cancer 09/10/2020   Family history of melanoma 09/10/2020   Family history of ovarian cancer 09/10/2020   GERD (gastroesophageal reflux disease)    History of endometrial cancer 09/10/2020    Surgeries: Procedure(s): TOTAL KNEE ARTHROPLASTY on 10/20/2021   Consultants:   Discharged Condition: Improved  Hospital Course: Monica Perry is an 66 y.o. female who was admitted 10/20/2021 for operative treatment ofS/P total knee arthroplasty, right. Patient has severe unremitting pain that affects sleep, daily activities, and work/hobbies. After pre-op clearance the patient was taken to the operating room on 10/20/2021 and underwent  Procedure(s): TOTAL KNEE ARTHROPLASTY.    Patient was given perioperative antibiotics:  Anti-infectives (From admission, onward)    Start     Dose/Rate Route Frequency Ordered Stop   10/20/21 1900  ceFAZolin (ANCEF) IVPB 2g/100 mL premix        2 g 200 mL/hr over 30 Minutes Intravenous Every 6 hours 10/20/21 1630 10/21/21 0232   10/20/21 1045  ceFAZolin (ANCEF) IVPB 2g/100 mL premix        2 g 200 mL/hr over 30 Minutes Intravenous On call to O.R. 10/20/21 1032 10/20/21 1325        Patient was given sequential compression devices, early ambulation, and chemoprophylaxis to prevent DVT. Patient worked with PT and was meeting their goals regarding safe ambulation and transfers.  Patient benefited maximally from hospital stay and there were no complications.    Recent vital signs: No data found.    Recent laboratory studies: No results for input(s): "WBC", "HGB", "HCT", "PLT", "NA", "K", "CL", "CO2", "BUN", "CREATININE", "GLUCOSE", "INR", "CALCIUM" in the last 72 hours.  Invalid input(s): "PT", "2"   Discharge Medications:   Allergies as of 10/21/2021       Reactions   Advair Hfa [fluticasone-salmeterol] Swelling   Lips swell   Advil [ibuprofen] Swelling   Lips swell-with coating ONLY    Aspirin Swelling   Just only the orange coating aspirin   Biaxin [clarithromycin] Other (See Comments)   Lip swelling   Papaver Somniferum [papaver] Hives   Poppy seeds-hives   Red Dye Other (See Comments)   Swelling.   Septra [sulfamethoxazole-trimethoprim] Itching, Other (See Comments)   Lip numbness/swelling   Stevia Glycerite Extract [flavoring Agent] Other (See Comments)   Lip numbness   Sulfa Antibiotics Itching        Medication List     STOP taking these medications    ibuprofen 200 MG tablet Commonly known as: ADVIL       TAKE these medications    acetaminophen 500 MG tablet Commonly known as: TYLENOL Take 500 mg by mouth every 6 (six) hours as needed (for pain.).   aspirin 81 MG chewable tablet Chew 1 tablet (81 mg total) by mouth 2 (two) times daily for 28 days.   BIOTIN PO Take 1 capsule by mouth 3 (three) times a week.   calcium carbonate 500 MG chewable tablet Commonly known as: TUMS - dosed in mg elemental calcium Chew 2  tablets by mouth daily after lunch.   cholecalciferol 25 MCG (1000 UNIT) tablet Commonly known as: VITAMIN D3 Take 1,000 Units by mouth 2 (two) times a week.   methocarbamol 500 MG tablet Commonly known as: ROBAXIN Take 1 tablet (500 mg total) by mouth every 6 (six) hours as needed for muscle spasms.   multivitamin with minerals tablet Take 1 tablet by mouth in the morning.   oxyCODONE 5 MG immediate release tablet Commonly known as: Oxy IR/ROXICODONE Take 1-2 tablets (5-10 mg total) by mouth every 4 (four) hours as needed  for severe pain. Start with 1 tablet every 4 hours. Only take 2 for severe pain.   polyethylene glycol 17 g packet Commonly known as: MIRALAX / GLYCOLAX Take 17 g by mouth daily as needed for mild constipation.   pravastatin 20 MG tablet Commonly known as: PRAVACHOL Take 20 mg by mouth every evening.   tamoxifen 20 MG tablet Commonly known as: NOLVADEX Take 1 tablet (20 mg total) by mouth daily. What changed: when to take this               Discharge Care Instructions  (From admission, onward)           Start     Ordered   10/21/21 0000  Change dressing       Comments: Maintain surgical dressing until follow up in the clinic. If the edges start to pull up, may reinforce with tape. If the dressing is no longer working, may remove and cover with gauze and tape, but must keep the area dry and clean.  Call with any questions or concerns.   10/21/21 0823            Diagnostic Studies: No results found.  Disposition: Discharge disposition: 01-Home or Self Care       Discharge Instructions     Call MD / Call 911   Complete by: As directed    If you experience chest pain or shortness of breath, CALL 911 and be transported to the hospital emergency room.  If you develope a fever above 101 F, pus (white drainage) or increased drainage or redness at the wound, or calf pain, call your surgeon's office.   Change dressing   Complete by: As directed    Maintain surgical dressing until follow up in the clinic. If the edges start to pull up, may reinforce with tape. If the dressing is no longer working, may remove and cover with gauze and tape, but must keep the area dry and clean.  Call with any questions or concerns.   Constipation Prevention   Complete by: As directed    Drink plenty of fluids.  Prune juice may be helpful.  You may use a stool softener, such as Colace (over the counter) 100 mg twice a day.  Use MiraLax (over the counter) for constipation as needed.    Diet - low sodium heart healthy   Complete by: As directed    Increase activity slowly as tolerated   Complete by: As directed    Weight bearing as tolerated with assist device (walker, cane, etc) as directed, use it as long as suggested by your surgeon or therapist, typically at least 4-6 weeks.   Post-operative opioid taper instructions:   Complete by: As directed    POST-OPERATIVE OPIOID TAPER INSTRUCTIONS: It is important to wean off of your opioid medication as soon as possible. If you do not need pain medication after your surgery it is  ok to stop day one. Opioids include: Codeine, Hydrocodone(Norco, Vicodin), Oxycodone(Percocet, oxycontin) and hydromorphone amongst others.  Long term and even short term use of opiods can cause: Increased pain response Dependence Constipation Depression Respiratory depression And more.  Withdrawal symptoms can include Flu like symptoms Nausea, vomiting And more Techniques to manage these symptoms Hydrate well Eat regular healthy meals Stay active Use relaxation techniques(deep breathing, meditating, yoga) Do Not substitute Alcohol to help with tapering If you have been on opioids for less than two weeks and do not have pain than it is ok to stop all together.  Plan to wean off of opioids This plan should start within one week post op of your joint replacement. Maintain the same interval or time between taking each dose and first decrease the dose.  Cut the total daily intake of opioids by one tablet each day Next start to increase the time between doses. The last dose that should be eliminated is the evening dose.      TED hose   Complete by: As directed    Use stockings (TED hose) for 2 weeks on both leg(s).  You may remove them at night for sleeping.        Follow-up Information     Paralee Cancel, MD. Schedule an appointment as soon as possible for a visit on 11/04/2021.   Specialty: Orthopedic Surgery Contact information: [address] [place] [address] [phone]                  Signed: Irving Copas 11/04/2021, 9:15 AM

## 2021-11-05 DIAGNOSIS — R2689 Other abnormalities of gait and mobility: Secondary | ICD-10-CM | POA: Diagnosis not present

## 2021-11-05 DIAGNOSIS — M25561 Pain in right knee: Secondary | ICD-10-CM | POA: Diagnosis not present

## 2021-11-10 DIAGNOSIS — R2689 Other abnormalities of gait and mobility: Secondary | ICD-10-CM | POA: Diagnosis not present

## 2021-11-10 DIAGNOSIS — M25561 Pain in right knee: Secondary | ICD-10-CM | POA: Diagnosis not present

## 2021-11-13 DIAGNOSIS — M25561 Pain in right knee: Secondary | ICD-10-CM | POA: Diagnosis not present

## 2021-11-13 DIAGNOSIS — R2689 Other abnormalities of gait and mobility: Secondary | ICD-10-CM | POA: Diagnosis not present

## 2021-11-16 ENCOUNTER — Inpatient Hospital Stay: Payer: Medicare PPO | Attending: Hematology and Oncology | Admitting: Hematology and Oncology

## 2021-11-16 ENCOUNTER — Other Ambulatory Visit: Payer: Self-pay

## 2021-11-16 DIAGNOSIS — Z170 Estrogen receptor positive status [ER+]: Secondary | ICD-10-CM | POA: Insufficient documentation

## 2021-11-16 DIAGNOSIS — Z79899 Other long term (current) drug therapy: Secondary | ICD-10-CM | POA: Insufficient documentation

## 2021-11-16 DIAGNOSIS — Z7982 Long term (current) use of aspirin: Secondary | ICD-10-CM | POA: Diagnosis not present

## 2021-11-16 DIAGNOSIS — Z96651 Presence of right artificial knee joint: Secondary | ICD-10-CM | POA: Insufficient documentation

## 2021-11-16 DIAGNOSIS — R232 Flushing: Secondary | ICD-10-CM | POA: Diagnosis not present

## 2021-11-16 DIAGNOSIS — Z923 Personal history of irradiation: Secondary | ICD-10-CM | POA: Diagnosis not present

## 2021-11-16 DIAGNOSIS — Z79810 Long term (current) use of selective estrogen receptor modulators (SERMs): Secondary | ICD-10-CM | POA: Diagnosis not present

## 2021-11-16 DIAGNOSIS — D0511 Intraductal carcinoma in situ of right breast: Secondary | ICD-10-CM | POA: Diagnosis not present

## 2021-11-16 MED ORDER — TAMOXIFEN CITRATE 20 MG PO TABS: 20.0000 mg | ORAL_TABLET | Freq: Every evening | ORAL | 3 refills | Status: DC

## 2021-11-16 NOTE — Assessment & Plan Note (Addendum)
09/05/2020:Screening detected left breast calcifications 0.4 cm span, stereotactic biopsy revealed high-grade DCIS with calcifications and necrosis ER 80%, PR 5%  Recommendation: 1.  10/08/2020:Right lumpectomy [name]): high grade DCIS with necrosis, clear margins.  2. Followed by adjuvant radiation therapy completed 12/10/2020 3. Followed by antiestrogen therapy with tamoxifen 5 years started 01/01/2021  Tamoxifen toxicities: Mild hot flashes that were there even prior to starting tamoxifen  Recent right knee replacement surgery: There is mild swelling of the leg which is expected postoperatively.  She does not have any calf pain or tenderness.  Breast cancer surveillance: 1.  Breast exam 11/16/2021: Benign 2. mammogram 08/21/2021: Benign breast density category B  Return to clinic in 1 year for follow-up

## 2021-11-17 DIAGNOSIS — M25561 Pain in right knee: Secondary | ICD-10-CM | POA: Diagnosis not present

## 2021-11-17 DIAGNOSIS — R2689 Other abnormalities of gait and mobility: Secondary | ICD-10-CM | POA: Diagnosis not present

## 2021-11-19 DIAGNOSIS — R2689 Other abnormalities of gait and mobility: Secondary | ICD-10-CM | POA: Diagnosis not present

## 2021-11-19 DIAGNOSIS — M25561 Pain in right knee: Secondary | ICD-10-CM | POA: Diagnosis not present

## 2021-11-24 DIAGNOSIS — M25561 Pain in right knee: Secondary | ICD-10-CM | POA: Diagnosis not present

## 2021-11-24 DIAGNOSIS — R2689 Other abnormalities of gait and mobility: Secondary | ICD-10-CM | POA: Diagnosis not present

## 2021-11-25 DIAGNOSIS — Z471 Aftercare following joint replacement surgery: Secondary | ICD-10-CM | POA: Diagnosis not present

## 2021-11-25 DIAGNOSIS — Z96651 Presence of right artificial knee joint: Secondary | ICD-10-CM | POA: Diagnosis not present

## 2021-11-26 DIAGNOSIS — M25561 Pain in right knee: Secondary | ICD-10-CM | POA: Diagnosis not present

## 2021-11-26 DIAGNOSIS — R2689 Other abnormalities of gait and mobility: Secondary | ICD-10-CM | POA: Diagnosis not present

## 2021-12-01 DIAGNOSIS — M25561 Pain in right knee: Secondary | ICD-10-CM | POA: Diagnosis not present

## 2021-12-01 DIAGNOSIS — R2689 Other abnormalities of gait and mobility: Secondary | ICD-10-CM | POA: Diagnosis not present

## 2021-12-08 DIAGNOSIS — M25561 Pain in right knee: Secondary | ICD-10-CM | POA: Diagnosis not present

## 2021-12-08 DIAGNOSIS — R2689 Other abnormalities of gait and mobility: Secondary | ICD-10-CM | POA: Diagnosis not present

## 2021-12-10 DIAGNOSIS — R2689 Other abnormalities of gait and mobility: Secondary | ICD-10-CM | POA: Diagnosis not present

## 2021-12-10 DIAGNOSIS — M25561 Pain in right knee: Secondary | ICD-10-CM | POA: Diagnosis not present

## 2021-12-15 DIAGNOSIS — M25561 Pain in right knee: Secondary | ICD-10-CM | POA: Diagnosis not present

## 2021-12-15 DIAGNOSIS — R2689 Other abnormalities of gait and mobility: Secondary | ICD-10-CM | POA: Diagnosis not present

## 2021-12-17 DIAGNOSIS — R2689 Other abnormalities of gait and mobility: Secondary | ICD-10-CM | POA: Diagnosis not present

## 2021-12-17 DIAGNOSIS — M25561 Pain in right knee: Secondary | ICD-10-CM | POA: Diagnosis not present

## 2021-12-21 DIAGNOSIS — M25561 Pain in right knee: Secondary | ICD-10-CM | POA: Diagnosis not present

## 2021-12-21 DIAGNOSIS — R2689 Other abnormalities of gait and mobility: Secondary | ICD-10-CM | POA: Diagnosis not present

## 2021-12-24 DIAGNOSIS — M25561 Pain in right knee: Secondary | ICD-10-CM | POA: Diagnosis not present

## 2021-12-24 DIAGNOSIS — R2689 Other abnormalities of gait and mobility: Secondary | ICD-10-CM | POA: Diagnosis not present

## 2021-12-29 DIAGNOSIS — R2689 Other abnormalities of gait and mobility: Secondary | ICD-10-CM | POA: Diagnosis not present

## 2021-12-29 DIAGNOSIS — M25561 Pain in right knee: Secondary | ICD-10-CM | POA: Diagnosis not present

## 2021-12-31 DIAGNOSIS — R2689 Other abnormalities of gait and mobility: Secondary | ICD-10-CM | POA: Diagnosis not present

## 2021-12-31 DIAGNOSIS — M25561 Pain in right knee: Secondary | ICD-10-CM | POA: Diagnosis not present

## 2022-01-05 DIAGNOSIS — R2689 Other abnormalities of gait and mobility: Secondary | ICD-10-CM | POA: Diagnosis not present

## 2022-01-05 DIAGNOSIS — M25561 Pain in right knee: Secondary | ICD-10-CM | POA: Diagnosis not present

## 2022-01-07 DIAGNOSIS — R2689 Other abnormalities of gait and mobility: Secondary | ICD-10-CM | POA: Diagnosis not present

## 2022-01-07 DIAGNOSIS — M25561 Pain in right knee: Secondary | ICD-10-CM | POA: Diagnosis not present

## 2022-01-25 DIAGNOSIS — R2689 Other abnormalities of gait and mobility: Secondary | ICD-10-CM | POA: Diagnosis not present

## 2022-01-25 DIAGNOSIS — M25561 Pain in right knee: Secondary | ICD-10-CM | POA: Diagnosis not present

## 2022-04-01 DIAGNOSIS — M25561 Pain in right knee: Secondary | ICD-10-CM | POA: Diagnosis not present

## 2022-04-01 DIAGNOSIS — Z96651 Presence of right artificial knee joint: Secondary | ICD-10-CM | POA: Diagnosis not present

## 2022-05-12 DIAGNOSIS — H35373 Puckering of macula, bilateral: Secondary | ICD-10-CM | POA: Diagnosis not present

## 2022-05-12 DIAGNOSIS — H5203 Hypermetropia, bilateral: Secondary | ICD-10-CM | POA: Diagnosis not present

## 2022-05-12 DIAGNOSIS — H04123 Dry eye syndrome of bilateral lacrimal glands: Secondary | ICD-10-CM | POA: Diagnosis not present

## 2022-05-12 DIAGNOSIS — H43813 Vitreous degeneration, bilateral: Secondary | ICD-10-CM | POA: Diagnosis not present

## 2022-05-12 DIAGNOSIS — H35342 Macular cyst, hole, or pseudohole, left eye: Secondary | ICD-10-CM | POA: Diagnosis not present

## 2022-07-13 DIAGNOSIS — Z9181 History of falling: Secondary | ICD-10-CM | POA: Diagnosis not present

## 2022-07-13 DIAGNOSIS — Z0000 Encounter for general adult medical examination without abnormal findings: Secondary | ICD-10-CM | POA: Diagnosis not present

## 2022-07-13 DIAGNOSIS — E782 Mixed hyperlipidemia: Secondary | ICD-10-CM | POA: Diagnosis not present

## 2022-07-13 DIAGNOSIS — E559 Vitamin D deficiency, unspecified: Secondary | ICD-10-CM | POA: Diagnosis not present

## 2022-07-13 DIAGNOSIS — D0511 Intraductal carcinoma in situ of right breast: Secondary | ICD-10-CM | POA: Diagnosis not present

## 2022-07-13 DIAGNOSIS — M8588 Other specified disorders of bone density and structure, other site: Secondary | ICD-10-CM | POA: Diagnosis not present

## 2022-07-13 DIAGNOSIS — Z1211 Encounter for screening for malignant neoplasm of colon: Secondary | ICD-10-CM | POA: Diagnosis not present

## 2022-07-13 DIAGNOSIS — Z1331 Encounter for screening for depression: Secondary | ICD-10-CM | POA: Diagnosis not present

## 2022-08-23 DIAGNOSIS — Z853 Personal history of malignant neoplasm of breast: Secondary | ICD-10-CM | POA: Diagnosis not present

## 2022-10-14 DIAGNOSIS — Z1211 Encounter for screening for malignant neoplasm of colon: Secondary | ICD-10-CM | POA: Diagnosis not present

## 2022-11-10 ENCOUNTER — Telehealth: Payer: Self-pay | Admitting: Hematology and Oncology

## 2022-11-10 NOTE — Telephone Encounter (Signed)
Patient stated they would unavailable the original date of appointment; patient is aware of rescheduled appointment dates and times

## 2022-11-13 ENCOUNTER — Other Ambulatory Visit: Payer: Self-pay | Admitting: Hematology and Oncology

## 2022-11-17 ENCOUNTER — Ambulatory Visit: Payer: Medicare PPO | Admitting: Hematology and Oncology

## 2022-11-22 ENCOUNTER — Telehealth: Payer: Self-pay

## 2022-11-22 NOTE — Telephone Encounter (Signed)
Called and LVM regarding 12/06/22 0800 MD appt. Appt recheduled for 1230 on same day due to MD conflict. Gave call back number with any questions.

## 2022-12-06 ENCOUNTER — Other Ambulatory Visit: Payer: Self-pay

## 2022-12-06 ENCOUNTER — Ambulatory Visit: Payer: Medicare PPO | Admitting: Hematology and Oncology

## 2022-12-06 ENCOUNTER — Inpatient Hospital Stay: Payer: Medicare PPO | Attending: Hematology and Oncology | Admitting: Hematology and Oncology

## 2022-12-06 VITALS — BP 160/85 | HR 93 | Temp 97.9°F | Resp 18 | Ht 64.0 in | Wt 186.7 lb

## 2022-12-06 DIAGNOSIS — Z79810 Long term (current) use of selective estrogen receptor modulators (SERMs): Secondary | ICD-10-CM | POA: Diagnosis not present

## 2022-12-06 DIAGNOSIS — Z170 Estrogen receptor positive status [ER+]: Secondary | ICD-10-CM | POA: Insufficient documentation

## 2022-12-06 DIAGNOSIS — D0511 Intraductal carcinoma in situ of right breast: Secondary | ICD-10-CM | POA: Insufficient documentation

## 2022-12-06 NOTE — Assessment & Plan Note (Signed)
09/05/2020:Screening detected left breast calcifications 0.4 cm span, stereotactic biopsy revealed high-grade DCIS with calcifications and necrosis ER 80%, PR 5%   Recommendation: 1.  10/08/2020:Right lumpectomy Dwain Sarna): high grade DCIS with necrosis, clear margins.  2. Followed by adjuvant radiation therapy completed 12/10/2020 3. Followed by antiestrogen therapy with tamoxifen 5 years started 01/01/2021   Tamoxifen toxicities: Mild hot flashes that were there even prior to starting tamoxifen   Recent right knee replacement surgery: There is mild swelling of the leg which is expected postoperatively.  She does not have any calf pain or tenderness.  If the swelling does not get better in a week we will obtain ultrasound of the legs to check.  She has an appointment with her orthopedic surgeon next week as well.   Breast cancer surveillance: 1.  Breast exam 12/06/2022: Benign 2. mamm[facility]21/2021 [facility]: Benign breast density category B   Return to clinic in 1 year for follow-up

## 2022-12-06 NOTE — Progress Notes (Signed)
Patient Care Team: Deatra James, MD as PCP - General (Family Medicine) Emelia Loron, MD as Consulting Physician (General Surgery) Serena Croissant, MD as Consulting Physician (Hematology and Oncology) Antony Blackbird, MD as Consulting Physician (Radiation Oncology)  DIAGNOSIS:  Encounter Diagnosis  Name Primary?   Ductal carcinoma in situ (DCIS) of right breast Yes    SUMMARY OF ONCOLOGIC HISTORY: 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%   09/10/2020 Cancer Staging   Staging form: Breast, AJCC 8th Edition - Clinical stage from 09/10/2020: Stage 0 (cTis (DCIS), cN0, cM0, ER+, PR+, HER2: Not Assessed) - Signed by Serena Croissant, MD on 09/10/2020 Stage prefix: Initial diagnosis Nuclear grade: G3 Laterality: Right Staged by: Pathologist and managing physician Stage used in treatment planning: Yes National guidelines used in treatment planning: Yes Type of national guideline used in treatment planning: NCCN   09/16/2020 Genetic Testing   Negative hereditary cancer genetic testing: no pathogenic variants detected in [facility] BRCAPlus Panel or [facility] CancerNext-Expanded +RNAinsight Panel.  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 The report dates are Sep 16, 2020 and Sep 30, 2020.    The BRCAplus panel offered by [employer] and includes sequencing and deletion/duplication analysis for the following 8 genes: ATM, BRCA1, BRCA2, CDH1, CHEK2, PALB2, PTEN, and TP53.  The CancerNext-Expanded gene panel offered by [facility] and includes sequencing, rearrangement, and RNA analysis for the following 77 genes: AIP, ALK, APC, ATM, AXIN2, BAP1, BARD1, BLM, BMPR1A, BRCA1, BRCA2, BRIP1, CDC73, CDH1, CDK4, CDKN1B, CDKN2A, CHEK2, CTNNA1, DICER1, FANCC, FH, FLCN,  GALNT12, KIF1B, LZTR1, MAX, MEN1, MET, MLH1, MSH2, MSH3, MSH6, MUTYH, NBN, NF1, NF2, NTHL1, PALB2, PHOX2B, PMS2, POT1, PRKAR1A, PTCH1, PTEN, RAD51C, RAD51D, RB1, RECQL, RET, SDHA, SDHAF2, SDHB, SDHC, SDHD, SMAD4, SMARCA4, SMARCB1, SMARCE1, STK11, SUFU, TMEM127, TP53, TSC1, TSC2, VHL and XRCC2 (sequencing and deletion/duplication); EGFR, EGLN1, HOXB13, KIT, MITF, PDGFRA, POLD1, and POLE (sequencing only); EPCAM and GREM1 (deletion/duplication only).   The variant of uncertain significance (VUS) in PTCH1 at p.R1206C has been reclassified to likely benign.  The change in variant classification was made as a result of re-review of evidence in light of new variant interpretation guidelines and/or new information. The amended report date is April 17, 2021.    10/08/2020 Surgery   Right lumpectomy Dwain Sarna): high grade DCIS with necrosis, clear margins.    10/08/2020 Cancer Staging   Staging form: Breast, AJCC 8th Edition - Pathologic stage from 10/08/2020: Stage 0 (pTis (DCIS), pN0, cM0) - Signed by Loa Socks, NP on 03/11/2021 Stage prefix: Initial diagnosis   11/18/2020 - 12/10/2020 Radiation Therapy   11/18/2020 through 12/10/2020 Site Technique Total Dose (Gy) Dose per Fx (Gy) Completed Fx Beam Energies  Breast, Right: Breast_Rt 3D 42.72/42.72 2.67 16/16 6X, 10X      01/2021 -  Anti-estrogen oral therapy   Tamoxifen daily x 5 years     CHIEF COMPLIANT: Follow-up of right breast cancer on tamoxifen   INTERVAL HISTORY: Monica Perry is a 67 y.o. with above-mentioned history of DCIS of the right breast. She presents to the clinic today for follow-up.  She has chronic hot flashes which are no different than before.  Denies any other side effects to tamoxifen.  She has chronic leg swelling which is stable as before.   ALLERGIES:  is allergic  to advair hfa [fluticasone-salmeterol], advil [ibuprofen], aspirin, biaxin [clarithromycin], papaver somniferum [papaver], red dye, septra  [sulfamethoxazole-trimethoprim], stevia glycerite extract [flavoring agent], stevioside, and sulfa antibiotics.  MEDICATIONS:  Current Outpatient Medications  Medication Sig Dispense Refill   BIOTIN PO Take 1 capsule by mouth 3 (three) times a week.     calcium carbonate (TUMS - DOSED IN MG ELEMENTAL CALCIUM) 500 MG chewable tablet Chew 2 tablets by mouth daily after lunch.     cholecalciferol (VITAMIN D3) 25 MCG (1000 UNIT) tablet Take 1,000 Units by mouth 2 (two) times a week.     Multiple Vitamins-Minerals (MULTIVITAMIN WITH MINERALS) tablet Take 1 tablet by mouth in the morning.     pravastatin (PRAVACHOL) 20 MG tablet Take 20 mg by mouth every evening.     tamoxifen (NOLVADEX) 20 MG tablet TAKE 1 TABLET BY MOUTH EVERY DAY IN THE EVENING 90 tablet 3   No current facility-administered medications for this visit.    PHYSICAL EXAMINATION: ECOG PERFORMANCE STATUS: 1 - Symptomatic but completely ambulatory  Vitals:   12/06/22 1223  BP: (!) 160/85  Pulse: 93  Resp: 18  Temp: 97.9 F (36.6 C)  SpO2: 98%   Filed Weights   12/06/22 1223  Weight: 186 lb 11.2 oz (84.7 kg)    BREAST: No palpable masses or nodules in either right or left breasts. No palpable axillary supraclavicular or infraclavicular adenopathy no breast tenderness or nipple discharge. (exam performed in the presence of a chaperone)  LABORATORY DATA:  I have reviewed the data as listed    Latest Ref Rng & Units 10/21/2021    3:38 AM 09/10/2020    8:22 AM 01/03/2015    9:12 AM  CMP  Glucose 70 - 99 mg/dL 469  629  528   BUN 8 - 23 mg/dL 14  12  17    Creatinine 0.44 - 1.00 mg/dL 4.13  2.44  0.10   Sodium 135 - 145 mmol/L 136  142  135   Potassium 3.5 - 5.1 mmol/L 4.0  3.9  4.3   Chloride 98 - 111 mmol/L 106  105  103   CO2 22 - 32 mmol/L 24  26    Calcium 8.9 - 10.3 mg/dL 8.3  27.2    Total Protein 6.5 - 8.1 g/dL  7.2    Total Bilirubin 0.3 - 1.2 mg/dL  0.6    Alkaline Phos 38 - 126 U/L  79    AST 15 - 41 U/L   19    ALT 0 - 44 U/L  16      Lab Results  Component Value Date   WBC 12.5 (H) 10/21/2021   HGB 10.8 (L) 10/21/2021   HCT 33.5 (L) 10/21/2021   MCV 92.3 10/21/2021   PLT 235 10/21/2021   NEUTROABS 5.5 09/10/2020    ASSESSMENT & PLAN:  Ductal carcinoma in situ (DCIS) of right breast 09/05/2020:Screening detected left breast calcifications 0.4 cm span, stereotactic biopsy revealed high-grade DCIS with calcifications and necrosis ER 80%, PR 5% Genetic testing: Negative  Recommendation: 1.  10/08/2020:Right lumpectomy Dwain Sarna): high grade DCIS with necrosis, clear margins.  2. Followed by adjuvant radiation therapy completed 12/10/2020 3. Followed by antiestrogen therapy with tamoxifen 5 years started 01/01/2021   Tamoxifen toxicities: Mild hot flashes that were there even prior to starting tamoxifen   Recent right knee replacement surgery: Doing much better.   Breast cancer surveillance: 1.  Breast exam 12/06/2022: Benign 2. mammogram 08/21/2021 at [facility]: Benign breast density  category B   Return to clinic in 1 year for follow-up   No orders of the defined types were placed in this encounter.  The patient has a good understanding of the overall plan. she agrees with it. she will call with any problems that may develop before the next visit here. Total time spent: 30 mins including face to face time and time spent for planning, charting and co-ordination of care   Tamsen Meek, MD 12/06/22    I Janan Ridge am acting as a Neurosurgeon for [employer]  I have reviewed the above documentation for accuracy and completeness, and I agree with the above.

## 2022-12-09 ENCOUNTER — Encounter: Payer: Self-pay | Admitting: Hematology and Oncology

## 2023-11-04 ENCOUNTER — Other Ambulatory Visit: Payer: Self-pay | Admitting: Hematology and Oncology

## 2023-12-06 ENCOUNTER — Inpatient Hospital Stay: Payer: Medicare PPO | Attending: Hematology and Oncology | Admitting: Hematology and Oncology

## 2023-12-06 VITALS — BP 140/75 | HR 88 | Temp 98.8°F | Resp 18 | Ht 64.0 in | Wt 185.3 lb

## 2023-12-06 DIAGNOSIS — Z79899 Other long term (current) drug therapy: Secondary | ICD-10-CM | POA: Insufficient documentation

## 2023-12-06 DIAGNOSIS — Z923 Personal history of irradiation: Secondary | ICD-10-CM | POA: Diagnosis not present

## 2023-12-06 DIAGNOSIS — Z79810 Long term (current) use of selective estrogen receptor modulators (SERMs): Secondary | ICD-10-CM | POA: Diagnosis not present

## 2023-12-06 DIAGNOSIS — D0511 Intraductal carcinoma in situ of right breast: Secondary | ICD-10-CM | POA: Insufficient documentation

## 2023-12-06 NOTE — Progress Notes (Signed)
 Patient Care Team: Sun, Vyvyan, MD as PCP - General (Family Medicine) Ebbie Cough, MD as Consulting Physician (General Surgery) Odean Potts, MD as Consulting Physician (Hematology and Oncology) Shannon Agent, MD as Consulting Physician (Radiation Oncology)  DIAGNOSIS:  Encounter Diagnosis  Name Primary?   Ductal carcinoma in situ (DCIS) of right breast Yes    SUMMARY OF ONCOLOGIC HISTORY: 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%   09/10/2020 Cancer Staging   Staging form: Breast, AJCC 8th Edition - Clinical stage from 09/10/2020: Stage 0 (cTis (DCIS), cN0, cM0, ER+, PR+, HER2: Not Assessed) - Signed by Odean Potts, MD on 09/10/2020 Stage prefix: Initial diagnosis Nuclear grade: G3 Laterality: Right Staged by: Pathologist and managing physician Stage used in treatment planning: Yes National guidelines used in treatment planning: Yes Type of national guideline used in treatment planning: NCCN   09/16/2020 Genetic Testing   Negative hereditary cancer genetic testing: no pathogenic variants detected in [facility] BRCAPlus Panel or [facility] CancerNext-Expanded +RNAinsight Panel.  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 The report dates are Sep 16, 2020 and Sep 30, 2020.    The BRCAplus panel offered by [employer] and includes sequencing and deletion/duplication analysis for the following 8 genes: ATM, BRCA1, BRCA2, CDH1, CHEK2, PALB2, PTEN, and TP53.  The CancerNext-Expanded gene panel offered by [facility] and includes sequencing, rearrangement, and RNA analysis for the following 77 genes: AIP, ALK, APC, ATM, AXIN2, BAP1, BARD1, BLM, BMPR1A, BRCA1, BRCA2, BRIP1, CDC73, CDH1, CDK4, CDKN1B, CDKN2A, CHEK2, CTNNA1, DICER1, FANCC, FH, FLCN,  GALNT12, KIF1B, LZTR1, MAX, MEN1, MET, MLH1, MSH2, MSH3, MSH6, MUTYH, NBN, NF1, NF2, NTHL1, PALB2, PHOX2B, PMS2, POT1, PRKAR1A, PTCH1, PTEN, RAD51C, RAD51D, RB1, RECQL, RET, SDHA, SDHAF2, SDHB, SDHC, SDHD, SMAD4, SMARCA4, SMARCB1, SMARCE1, STK11, SUFU, TMEM127, TP53, TSC1, TSC2, VHL and XRCC2 (sequencing and deletion/duplication); EGFR, EGLN1, HOXB13, KIT, MITF, PDGFRA, POLD1, and POLE (sequencing only); EPCAM and GREM1 (deletion/duplication only).   The variant of uncertain significance (VUS) in PTCH1 at p.R1206C has been reclassified to likely benign.  The change in variant classification was made as a result of re-review of evidence in light of new variant interpretation guidelines and/or new information. The amended report date is April 17, 2021.    10/08/2020 Surgery   Right lumpectomy Viktoria): high grade DCIS with necrosis, clear margins.    10/08/2020 Cancer Staging   Staging form: Breast, AJCC 8th Edition - Pathologic stage from 10/08/2020: Stage 0 (pTis (DCIS), pN0, cM0) - Signed by Crawford Morna Pickle, NP on 03/11/2021 Stage prefix: Initial diagnosis   11/18/2020 - 12/10/2020 Radiation Therapy   11/18/2020 through 12/10/2020 Site Technique Total Dose (Gy) Dose per Fx (Gy) Completed Fx Beam Energies  Breast, Right: Breast_Rt 3D 42.72/42.72 2.67 16/16 6X, 10X      01/2021 -  Anti-estrogen oral therapy   Tamoxifen  daily x 5 years     CHIEF COMPLIANT: Follow-up on tamoxifen  therapy  HISTORY OF PRESENT ILLNESS:  History of Present Illness Monica Perry is a 68 year old female with a history of breast cancer who presents for routine follow-up.  She is on tamoxifen  for three years with mild hot flashes as the only side effect. Her medications include tamoxifen  and a cholesterol medication, along with supplements. A mammogram in April at [facility] was normal, and she  is current with her screenings. She fractured her pinky finger and is in a cast.  Her husband just recently underwent  knee replacement surgery.     ALLERGIES:  is allergic to advair hfa [fluticasone-salmeterol], biaxin [clarithromycin], papaver somniferum [papaver], red dye #40 (allura red), septra [sulfamethoxazole-trimethoprim], stevia glycerite extract [flavoring agent (non-screening)], stevioside, and sulfa antibiotics.  MEDICATIONS:  Current Outpatient Medications  Medication Sig Dispense Refill   BIOTIN PO Take 1 capsule by mouth 3 (three) times a week.     calcium carbonate (TUMS - DOSED IN MG ELEMENTAL CALCIUM) 500 MG chewable tablet Chew 2 tablets by mouth daily after lunch.     cholecalciferol (VITAMIN D3) 25 MCG (1000 UNIT) tablet Take 1,000 Units by mouth 2 (two) times a week.     Multiple Vitamins-Minerals (MULTIVITAMIN WITH MINERALS) tablet Take 1 tablet by mouth in the morning.     pravastatin  (PRAVACHOL ) 20 MG tablet Take 20 mg by mouth every evening.     tamoxifen  (NOLVADEX ) 20 MG tablet TAKE 1 TABLET BY MOUTH EVERY DAY IN THE EVENING 90 tablet 3   No current facility-administered medications for this visit.    PHYSICAL EXAMINATION: ECOG PERFORMANCE STATUS: 1 - Symptomatic but completely ambulatory  Vitals:   12/06/23 1000  BP: (!) 140/75  Pulse: 88  Resp: 18  Temp: 98.8 F (37.1 C)  SpO2: 98%   Filed Weights   12/06/23 1000  Weight: 185 lb 4.8 oz (84.1 kg)    Physical Exam BREAST: Breasts normal on exam.  (exam performed in the presence of a chaperone)  LABORATORY DATA:  I have reviewed the data as listed    Latest Ref Rng & Units 10/21/2021    3:38 AM 09/10/2020    8:22 AM 01/03/2015    9:12 AM  CMP  Glucose 70 - 99 mg/dL 864  897  891   BUN 8 - 23 mg/dL 14  12  17    Creatinine 0.44 - 1.00 mg/dL 9.26  9.26  9.29   Sodium 135 - 145 mmol/L 136  142  135   Potassium 3.5 - 5.1 mmol/L 4.0  3.9  4.3   Chloride 98 - 111 mmol/L 106  105  103   CO2 22 - 32 mmol/L 24  26    Calcium 8.9 - 10.3 mg/dL 8.3  89.5    Total Protein 6.5 - 8.1 g/dL  7.2    Total Bilirubin 0.3 -  1.2 mg/dL  0.6    Alkaline Phos 38 - 126 U/L  79    AST 15 - 41 U/L  19    ALT 0 - 44 U/L  16      Lab Results  Component Value Date   WBC 12.5 (H) 10/21/2021   HGB 10.8 (L) 10/21/2021   HCT 33.5 (L) 10/21/2021   MCV 92.3 10/21/2021   PLT 235 10/21/2021   NEUTROABS 5.5 09/10/2020    ASSESSMENT & PLAN:  Ductal carcinoma in situ (DCIS) of right breast 09/05/2020:Screening detected left breast calcifications 0.4 cm span, stereotactic biopsy revealed high-grade DCIS with calcifications and necrosis ER 80%, PR 5% Genetic testing: Negative   Recommendation: 1.  10/08/2020:Right lumpectomy Viktoria): high grade DCIS with necrosis, clear margins.  2. Followed by adjuvant radiation therapy completed 12/10/2020 3. Followed by antiestrogen therapy with tamoxifen  5 years started 01/01/2021   Tamoxifen  toxicities: Mild hot flashes that were there even prior to starting tamoxifen    Recent right knee replacement surgery: Doing much better.  Breast cancer surveillance: 1.  Breast exam 12/06/2023: Benign 2. mammogram 08/23/2022 at [facility]: Benign breast density category B   Return to clinic in 1 year for follow-up ------------------------------------- Assessment and Plan Assessment & Plan Ductal carcinoma in situ (DCIS) of right breast, on adjuvant tamoxifen  DCIS of the right breast, on adjuvant tamoxifen  for three years. Recent mammogram normal. Tolerating tamoxifen  well with mild hot flashes. Discussed stopping tamoxifen  one week before and after surgery due to long-lasting effects. - Continue tamoxifen  20 mg oral daily. - Stop tamoxifen  one week before and one week after any surgery if needed. - Continue annual mammograms. - Schedule follow-up in one year.  Chronic hot flashes secondary to tamoxifen  therapy Chronic hot flashes secondary to tamoxifen  therapy. Mild and well-tolerated. - Continue current management as symptoms are mild and well-tolerated.      No orders of the defined  types were placed in this encounter.  The patient has a good understanding of the overall plan. she agrees with it. she will call with any problems that may develop before the next visit here. Total time spent: 30 mins including face to face time and time spent for planning, charting and co-ordination of care   Naomi MARLA Chad, MD 12/06/23

## 2023-12-06 NOTE — Assessment & Plan Note (Signed)
 09/05/2020:Screening detected left breast calcifications 0.4 cm span, stereotactic biopsy revealed high-grade DCIS with calcifications and necrosis ER 80%, PR 5% Genetic testing: Negative   Recommendation: 1.  10/08/2020:Right lumpectomy Viktoria): high grade DCIS with necrosis, clear margins.  2. Followed by adjuvant radiation therapy completed 12/10/2020 3. Followed by antiestrogen therapy with tamoxifen  5 years started 01/01/2021   Tamoxifen  toxicities: Mild hot flashes that were there even prior to starting tamoxifen    Recent right knee replacement surgery: Doing much better.   Breast cancer surveillance: 1.  Breast exam 12/06/2023: Benign 2. mammogram 08/23/2022 at [facility]: Benign breast density category B   Return to clinic in 1 year for follow-up

## 2024-12-05 ENCOUNTER — Ambulatory Visit: Admitting: Hematology and Oncology
